# Patient Record
Sex: Male | Born: 1960 | Race: White | Hispanic: No | Marital: Married | Smoking: Former smoker
Health system: Southern US, Community
[De-identification: ages and names within clinical notes are randomized; demographics above are authoritative.]

## PROBLEM LIST (undated history)

## (undated) DIAGNOSIS — K219 Gastro-esophageal reflux disease without esophagitis: Secondary | ICD-10-CM

## (undated) DIAGNOSIS — E785 Hyperlipidemia, unspecified: Secondary | ICD-10-CM

## (undated) DIAGNOSIS — R7303 Prediabetes: Secondary | ICD-10-CM

## (undated) DIAGNOSIS — I10 Essential (primary) hypertension: Secondary | ICD-10-CM

## (undated) DIAGNOSIS — N200 Calculus of kidney: Secondary | ICD-10-CM

## (undated) DIAGNOSIS — R319 Hematuria, unspecified: Secondary | ICD-10-CM

## (undated) HISTORY — PX: HERNIA REPAIR: SHX51

## (undated) HISTORY — DX: Hyperlipidemia, unspecified: E78.5

## (undated) HISTORY — PX: KNEE ARTHROSCOPY: SUR90

## (undated) HISTORY — PX: FOOT SURGERY: SHX648

## (undated) HISTORY — PX: NASAL SINUS SURGERY: SHX719

---

## 2004-02-12 ENCOUNTER — Other Ambulatory Visit: Payer: Self-pay

## 2005-04-02 ENCOUNTER — Other Ambulatory Visit: Payer: Self-pay

## 2005-04-03 ENCOUNTER — Inpatient Hospital Stay: Payer: Self-pay | Admitting: Infectious Diseases

## 2009-07-14 HISTORY — PX: CHOLECYSTECTOMY: SHX55

## 2009-11-24 ENCOUNTER — Encounter: Admission: RE | Admit: 2009-11-24 | Discharge: 2009-11-24 | Payer: Self-pay | Admitting: Orthopedic Surgery

## 2010-07-23 ENCOUNTER — Emergency Department: Payer: Self-pay | Admitting: Emergency Medicine

## 2013-02-01 ENCOUNTER — Encounter: Payer: Self-pay | Admitting: Family Medicine

## 2013-02-01 ENCOUNTER — Ambulatory Visit (INDEPENDENT_AMBULATORY_CARE_PROVIDER_SITE_OTHER): Payer: PRIVATE HEALTH INSURANCE | Admitting: Family Medicine

## 2013-02-01 VITALS — BP 130/82 | Wt 299.2 lb

## 2013-02-01 DIAGNOSIS — Z79899 Other long term (current) drug therapy: Secondary | ICD-10-CM

## 2013-02-01 DIAGNOSIS — E785 Hyperlipidemia, unspecified: Secondary | ICD-10-CM

## 2013-02-01 DIAGNOSIS — R7303 Prediabetes: Secondary | ICD-10-CM

## 2013-02-01 DIAGNOSIS — R7309 Other abnormal glucose: Secondary | ICD-10-CM

## 2013-02-01 MED ORDER — PRAVASTATIN SODIUM 10 MG PO TABS: 10.0000 mg | ORAL_TABLET | Freq: Every evening | ORAL | Status: DC

## 2013-02-01 NOTE — Patient Instructions (Signed)
DASH Diet  The DASH diet stands for "Dietary Approaches to Stop Hypertension." It is a healthy eating plan that has been shown to reduce high blood pressure (hypertension) in as little as 14 days, while also possibly providing other significant health benefits. These other health benefits include reducing the risk of breast cancer after menopause and reducing the risk of type 2 diabetes, heart disease, colon cancer, and stroke. Health benefits also include weight loss and slowing kidney failure in patients with chronic kidney disease.   DIET GUIDELINES  · Limit salt (sodium). Your diet should contain less than 1500 mg of sodium daily.  · Limit refined or processed carbohydrates. Your diet should include mostly whole grains. Desserts and added sugars should be used sparingly.  · Include small amounts of heart-healthy fats. These types of fats include nuts, oils, and tub margarine. Limit saturated and trans fats. These fats have been shown to be harmful in the body.  CHOOSING FOODS   The following food groups are based on a 2000 calorie diet. See your Registered Dietitian for individual calorie needs.  Grains and Grain Products (6 to 8 servings daily)  · Eat More Often: Whole-wheat bread, brown rice, whole-grain or wheat pasta, quinoa, popcorn without added fat or salt (air popped).  · Eat Less Often: White bread, white pasta, white rice, cornbread.  Vegetables (4 to 5 servings daily)  · Eat More Often: Fresh, frozen, and canned vegetables. Vegetables may be raw, steamed, roasted, or grilled with a minimal amount of fat.  · Eat Less Often/Avoid: Creamed or fried vegetables. Vegetables in a cheese sauce.  Fruit (4 to 5 servings daily)  · Eat More Often: All fresh, canned (in natural juice), or frozen fruits. Dried fruits without added sugar. One hundred percent fruit juice (½ cup [237 mL] daily).  · Eat Less Often: Dried fruits with added sugar. Canned fruit in light or heavy syrup.  Lean Meats, Fish, and Poultry (2  servings or less daily. One serving is 3 to 4 oz [85-114 g]).  · Eat More Often: Ninety percent or leaner ground beef, tenderloin, sirloin. Round cuts of beef, chicken breast, turkey breast. All fish. Grill, bake, or broil your meat. Nothing should be fried.  · Eat Less Often/Avoid: Fatty cuts of meat, turkey, or chicken leg, thigh, or wing. Fried cuts of meat or fish.  Dairy (2 to 3 servings)  · Eat More Often: Low-fat or fat-free milk, low-fat plain or light yogurt, reduced-fat or part-skim cheese.  · Eat Less Often/Avoid: Milk (whole, 2%). Whole milk yogurt. Full-fat cheeses.  Nuts, Seeds, and Legumes (4 to 5 servings per week)  · Eat More Often: All without added salt.  · Eat Less Often/Avoid: Salted nuts and seeds, canned beans with added salt.  Fats and Sweets (limited)  · Eat More Often: Vegetable oils, tub margarines without trans fats, sugar-free gelatin. Mayonnaise and salad dressings.  · Eat Less Often/Avoid: Coconut oils, palm oils, butter, stick margarine, cream, half and half, cookies, candy, pie.  FOR MORE INFORMATION  The Dash Diet Eating Plan: www.dashdiet.org  Document Released: 06/19/2011 Document Revised: 09/22/2011 Document Reviewed: 06/19/2011  [employer]® Patient Information ©2014 [employer].

## 2013-02-01 NOTE — Progress Notes (Signed)
  Subjective:    Patient ID: Phillip Spencer, male    DOB: [birth year], 52 y.o.   MRN: [id number]  HPI  Patient comes in to be established. 30 minutes spent with patient discussing his issues including obesity hyperlipidemia mild elevation of glucose. He denies rectal bleeding he had a colonoscopy a few years ago. Patient does not smoke family history social history medical history reviewed   Review of Systems  Constitutional: Negative for fever, activity change and appetite change.  HENT: Negative for congestion, rhinorrhea and neck pain.   Eyes: Negative for discharge.  Respiratory: Negative for cough and wheezing.   Cardiovascular: Negative for chest pain.  Gastrointestinal: Negative for vomiting, abdominal pain and blood in stool.  Genitourinary: Negative for frequency and difficulty urinating.  Skin: Negative for rash.  Allergic/Immunologic: Negative for environmental allergies and food allergies.  Neurological: Negative for weakness and headaches.  Psychiatric/Behavioral: Negative for agitation.       Objective:   Physical Exam  Constitutional: He appears well-developed and well-nourished.  HENT:  Head: Normocephalic and atraumatic.  Right Ear: External ear normal.  Left Ear: External ear normal.  Nose: Nose normal.  Mouth/Throat: Oropharynx is clear and moist.  Eyes: EOM are normal. Pupils are equal, round, and reactive to light.  Neck: Normal range of motion. Neck supple. No thyromegaly present.  Cardiovascular: Normal rate, regular rhythm and normal heart sounds.   No murmur heard. Pulmonary/Chest: Effort normal and breath sounds normal. No respiratory distress. He has no wheezes.  Abdominal: Soft. Bowel sounds are normal. He exhibits no distension and no mass. There is no tenderness.  Genitourinary: Prostate normal.  Musculoskeletal: Normal range of motion. He exhibits no edema.  Lymphadenopathy:    He has no cervical adenopathy.  Neurological: He is alert. He  exhibits normal muscle tone.  Skin: Skin is warm and dry. No erythema.  Psychiatric: He has a normal mood and affect. His behavior is normal. Judgment normal.          Assessment & Plan:  Obesity-patient is trying to lose 25-30 pounds I think that would be a very good idea he is currently in [employer]. We talked about diet and exercise. Hyperlipidemia in the past he has not tolerated statins. Lipitor caused him severe muscle pain and joint pain. Try pravastatin 10 mg, start off one 3 times a week Monday Wednesday Friday. If any side effects followup immediately or call us. May stop medicine if necessary. Check lipid profile in approximately 4-6 weeks. Slowly titrate medication. Prediabetes-discussed the importance of minimizing starches exercising losing weight. Recheck hemoglobin A1c again in 3-6 months.

## 2013-04-01 LAB — LIPID PANEL
Total CHOL/HDL Ratio: 3.9 Ratio
VLDL: 36 mg/dL (ref 0–40)

## 2013-04-01 LAB — HEPATIC FUNCTION PANEL
ALT: 32 U/L (ref 0–53)
Albumin: 4.3 g/dL (ref 3.5–5.2)
Alkaline Phosphatase: 105 U/L (ref 39–117)
Indirect Bilirubin: 0.6 mg/dL (ref 0.0–0.9)
Total Protein: 7 g/dL (ref 6.0–8.3)

## 2013-05-04 ENCOUNTER — Ambulatory Visit: Payer: PRIVATE HEALTH INSURANCE | Admitting: Family Medicine

## 2013-05-05 ENCOUNTER — Ambulatory Visit: Payer: PRIVATE HEALTH INSURANCE | Admitting: Family Medicine

## 2013-05-12 ENCOUNTER — Ambulatory Visit (INDEPENDENT_AMBULATORY_CARE_PROVIDER_SITE_OTHER): Payer: PRIVATE HEALTH INSURANCE | Admitting: Family Medicine

## 2013-05-12 ENCOUNTER — Encounter: Payer: Self-pay | Admitting: Family Medicine

## 2013-05-12 VITALS — BP 120/90 | Ht 72.0 in | Wt 302.0 lb

## 2013-05-12 DIAGNOSIS — R7309 Other abnormal glucose: Secondary | ICD-10-CM

## 2013-05-12 DIAGNOSIS — Z79899 Other long term (current) drug therapy: Secondary | ICD-10-CM

## 2013-05-12 DIAGNOSIS — E785 Hyperlipidemia, unspecified: Secondary | ICD-10-CM

## 2013-05-12 DIAGNOSIS — R7303 Prediabetes: Secondary | ICD-10-CM

## 2013-05-12 NOTE — Patient Instructions (Signed)
DASH Diet  The DASH diet stands for "Dietary Approaches to Stop Hypertension." It is a healthy eating plan that has been shown to reduce high blood pressure (hypertension) in as little as 14 days, while also possibly providing other significant health benefits. These other health benefits include reducing the risk of breast cancer after menopause and reducing the risk of type 2 diabetes, heart disease, colon cancer, and stroke. Health benefits also include weight loss and slowing kidney failure in patients with chronic kidney disease.   DIET GUIDELINES  · Limit salt (sodium). Your diet should contain less than 1500 mg of sodium daily.  · Limit refined or processed carbohydrates. Your diet should include mostly whole grains. Desserts and added sugars should be used sparingly.  · Include small amounts of heart-healthy fats. These types of fats include nuts, oils, and tub margarine. Limit saturated and trans fats. These fats have been shown to be harmful in the body.  CHOOSING FOODS   The following food groups are based on a 2000 calorie diet. See your Registered Dietitian for individual calorie needs.  Grains and Grain Products (6 to 8 servings daily)  · Eat More Often: Whole-wheat bread, brown rice, whole-grain or wheat pasta, quinoa, popcorn without added fat or salt (air popped).  · Eat Less Often: White bread, white pasta, white rice, cornbread.  Vegetables (4 to 5 servings daily)  · Eat More Often: Fresh, frozen, and canned vegetables. Vegetables may be raw, steamed, roasted, or grilled with a minimal amount of fat.  · Eat Less Often/Avoid: Creamed or fried vegetables. Vegetables in a cheese sauce.  Fruit (4 to 5 servings daily)  · Eat More Often: All fresh, canned (in natural juice), or frozen fruits. Dried fruits without added sugar. One hundred percent fruit juice (½ cup [237 mL] daily).  · Eat Less Often: Dried fruits with added sugar. Canned fruit in light or heavy syrup.  Lean Meats, Fish, and Poultry (2  servings or less daily. One serving is 3 to 4 oz [85-114 g]).  · Eat More Often: Ninety percent or leaner ground beef, tenderloin, sirloin. Round cuts of beef, chicken breast, turkey breast. All fish. Grill, bake, or broil your meat. Nothing should be fried.  · Eat Less Often/Avoid: Fatty cuts of meat, turkey, or chicken leg, thigh, or wing. Fried cuts of meat or fish.  Dairy (2 to 3 servings)  · Eat More Often: Low-fat or fat-free milk, low-fat plain or light yogurt, reduced-fat or part-skim cheese.  · Eat Less Often/Avoid: Milk (whole, 2%). Whole milk yogurt. Full-fat cheeses.  Nuts, Seeds, and Legumes (4 to 5 servings per week)  · Eat More Often: All without added salt.  · Eat Less Often/Avoid: Salted nuts and seeds, canned beans with added salt.  Fats and Sweets (limited)  · Eat More Often: Vegetable oils, tub margarines without trans fats, sugar-free gelatin. Mayonnaise and salad dressings.  · Eat Less Often/Avoid: Coconut oils, palm oils, butter, stick margarine, cream, half and half, cookies, candy, pie.  FOR MORE INFORMATION  The Dash Diet Eating Plan: www.dashdiet.org  Document Released: 06/19/2011 Document Revised: 09/22/2011 Document Reviewed: 06/19/2011  [employer]® Patient Information ©2014 [employer].

## 2013-05-12 NOTE — Progress Notes (Signed)
  Subjective:    Patient ID: Phillip Spencer, male    DOB: [birth year], 52 y.o.   MRN: [id number]  Hyperlipidemia This is a chronic problem. The current episode started more than 1 month ago. There are no known factors aggravating his hyperlipidemia. Current antihyperlipidemic treatment includes statins. The current treatment provides moderate improvement of lipids. There are no compliance problems.  There are no known risk factors for coronary artery disease.  Patient states he has no concerns at this time.  Patient under a lot of stress at work. Causes him to get angry at times and stressed out. Denies being suicidal or homicidal. PMH benign see previous notes does not smoke He does try to eat healthy but he relates he's not exercising  Review of Systems Denies chest tightness pressure pain shortness of breath swelling in the legs denies wheezing    Objective:   Physical Exam  Lungs are clear hearts regular pulse normal blood pressure is good on recheck.      Assessment & Plan:  #1 significant stress test discussed different ways to handle this #2 hyperlipidemia is using pravastatin 5 days a week check lab work see were wrapped may need to go up on the dose he is not having any side effects #3 hyperglycemia check hemoglobin A1c weight a result.

## 2013-10-21 LAB — BASIC METABOLIC PANEL
BUN: 14 mg/dL (ref 6–23)
CO2: 24 mEq/L (ref 19–32)
Calcium: 9.2 mg/dL (ref 8.4–10.5)
Chloride: 107 mEq/L (ref 96–112)
Creat: 0.81 mg/dL (ref 0.50–1.35)
Glucose, Bld: 102 mg/dL — ABNORMAL HIGH (ref 70–99)
Potassium: 4.1 mEq/L (ref 3.5–5.3)
Sodium: 142 mEq/L (ref 135–145)

## 2013-10-21 LAB — LIPID PANEL
Cholesterol: 203 mg/dL — ABNORMAL HIGH (ref 0–200)
HDL: 39 mg/dL — ABNORMAL LOW (ref >39)
Total CHOL/HDL Ratio: 5.2 Ratio
Triglycerides: 572 mg/dL — ABNORMAL HIGH (ref <150)

## 2013-10-21 LAB — HEMOGLOBIN A1C
Hgb A1c MFr Bld: 5.8 % — ABNORMAL HIGH (ref <5.7)
Mean Plasma Glucose: 120 mg/dL — ABNORMAL HIGH (ref <117)

## 2013-11-25 ENCOUNTER — Ambulatory Visit (INDEPENDENT_AMBULATORY_CARE_PROVIDER_SITE_OTHER): Payer: PRIVATE HEALTH INSURANCE | Admitting: Family Medicine

## 2013-11-25 ENCOUNTER — Encounter: Payer: Self-pay | Admitting: Family Medicine

## 2013-11-25 VITALS — BP 150/82 | Wt 310.0 lb

## 2013-11-25 DIAGNOSIS — E781 Pure hyperglyceridemia: Secondary | ICD-10-CM | POA: Insufficient documentation

## 2013-11-25 DIAGNOSIS — E785 Hyperlipidemia, unspecified: Secondary | ICD-10-CM

## 2013-11-25 DIAGNOSIS — Z79899 Other long term (current) drug therapy: Secondary | ICD-10-CM

## 2013-11-25 MED ORDER — FENOFIBRATE 160 MG PO TABS: 160.0000 mg | ORAL_TABLET | Freq: Every day | ORAL | Status: DC

## 2013-11-25 NOTE — Patient Instructions (Signed)
If develop muscle soreness stop pravastatin and let me know.  In 8 weeks repeat labs

## 2013-11-25 NOTE — Progress Notes (Signed)
   Subjective:    Patient ID: Phillip Spencer, male    DOB: Jul 08, 1961, 53 y.o.   MRN: [id number]  HPI  Follow up Lab results Patient states he did not do a good job taking care of himself during the winter he ate too much did not exercise in he has been under a lot of stress. Greater than 50 minutes spent with patient discussing his lab results.   Review of Systems Patient denies chest pain shortness breath nausea vomiting diarrhea or swelling    Objective:   Physical Exam Lungs are clear hearts regular pulse normal       Assessment & Plan:  Significant elevation of triglycerides as patient needs to dramatically lower this I recommend starting fenofibrate. If he starts having muscle aches with it stop pravastatin. In addition to this recheck lipid liver profile in 8 weeks' time. Patient is to work hard on losing weight in the future if he is able to significantly lose weight get things under control we might consider stopping the medication for 8-12 weeks to see if his cholesterol will stay under control. Patient often followup in 6 months but is paced around the findings of the next 8 weeks lab work.

## 2014-01-09 ENCOUNTER — Ambulatory Visit (INDEPENDENT_AMBULATORY_CARE_PROVIDER_SITE_OTHER): Payer: PRIVATE HEALTH INSURANCE | Admitting: Family Medicine

## 2014-01-09 ENCOUNTER — Encounter: Payer: Self-pay | Admitting: Family Medicine

## 2014-01-09 VITALS — BP 132/90 | Temp 98.5°F | Ht 72.0 in | Wt 314.0 lb

## 2014-01-09 DIAGNOSIS — H60391 Other infective otitis externa, right ear: Secondary | ICD-10-CM

## 2014-01-09 DIAGNOSIS — H60399 Other infective otitis externa, unspecified ear: Secondary | ICD-10-CM

## 2014-01-09 MED ORDER — OFLOXACIN 0.3 % OT SOLN: 5.0000 [drp] | Freq: Two times a day (BID) | OTIC | Status: DC

## 2014-01-09 MED ORDER — OFLOXACIN 0.3 % OT SOLN: 5.0000 [drp] | Freq: Two times a day (BID) | OTIC | Status: AC

## 2014-01-09 MED ORDER — CIPROFLOXACIN HCL 500 MG PO TABS
500.0000 mg | ORAL_TABLET | Freq: Two times a day (BID) | ORAL | Status: DC
Start: 2014-01-09 — End: 2014-01-09

## 2014-01-09 MED ORDER — CIPROFLOXACIN HCL 500 MG PO TABS: 500.0000 mg | ORAL_TABLET | Freq: Two times a day (BID) | ORAL | Status: DC

## 2014-01-09 NOTE — Progress Notes (Signed)
   Subjective:    Patient ID: Phillip Spencer, male    DOB: 1960-11-29, 53 y.o.   MRN: [id number]  Otalgia  There is pain in the right ear. This is a new problem. The current episode started in the past 7 days. The problem occurs constantly. The problem has been waxing and waning. There has been no fever. The pain is moderate. He has tried NSAIDs and ear drops for the symptoms. The treatment provided mild relief.    Started last weds ear pain, diminshed hearing   Review of Systems  HENT: Positive for ear pain.    denies high fever wheezing chest tightness pressure pain     Objective:   Physical Exam Lungs are clear heart regular rate otitis externa noted left eardrum normal       Assessment & Plan:  Otitis externa drops and antibiotics recommended followup if ongoing troubles  Regular followup later this year

## 2014-02-23 ENCOUNTER — Other Ambulatory Visit: Payer: Self-pay | Admitting: *Deleted

## 2014-02-23 MED ORDER — PRAVASTATIN SODIUM 10 MG PO TABS: 10.0000 mg | ORAL_TABLET | Freq: Every evening | ORAL | Status: DC

## 2014-03-12 ENCOUNTER — Emergency Department: Payer: Self-pay | Admitting: Emergency Medicine

## 2014-03-12 LAB — URINALYSIS, COMPLETE
BACTERIA: NONE SEEN
Bilirubin,UR: NEGATIVE
Glucose,UR: NEGATIVE mg/dL (ref 0–75)
KETONE: NEGATIVE
Leukocyte Esterase: NEGATIVE
Nitrite: NEGATIVE
PH: 6 (ref 4.5–8.0)
PROTEIN: NEGATIVE
Specific Gravity: 1.015 (ref 1.003–1.030)
WBC UR: 1 /HPF (ref 0–5)

## 2014-03-12 LAB — COMPREHENSIVE METABOLIC PANEL
ALBUMIN: 3.6 g/dL (ref 3.4–5.0)
ALK PHOS: 95 U/L
AST: 34 U/L (ref 15–37)
Anion Gap: 8 (ref 7–16)
BILIRUBIN TOTAL: 0.3 mg/dL (ref 0.2–1.0)
BUN: 13 mg/dL (ref 7–18)
CHLORIDE: 109 mmol/L — AB (ref 98–107)
CO2: 24 mmol/L (ref 21–32)
CREATININE: 1.2 mg/dL (ref 0.60–1.30)
Calcium, Total: 8.9 mg/dL (ref 8.5–10.1)
EGFR (Non-African Amer.): >60
Glucose: 140 mg/dL — ABNORMAL HIGH (ref 65–99)
OSMOLALITY: 284 (ref 275–301)
Potassium: 3.8 mmol/L (ref 3.5–5.1)
SGPT (ALT): 54 U/L
Sodium: 141 mmol/L (ref 136–145)
TOTAL PROTEIN: 7.1 g/dL (ref 6.4–8.2)

## 2014-03-12 LAB — CBC WITH DIFFERENTIAL/PLATELET
Basophil #: 0 10*3/uL (ref 0.0–0.1)
Basophil %: 0.5 %
Eosinophil #: 0.2 10*3/uL (ref 0.0–0.7)
Eosinophil %: 2.1 %
HCT: 43.7 % (ref 40.0–52.0)
HGB: 14.6 g/dL (ref 13.0–18.0)
LYMPHS ABS: 3 10*3/uL (ref 1.0–3.6)
LYMPHS PCT: 32.8 %
MCH: 31.6 pg (ref 26.0–34.0)
MCHC: 33.3 g/dL (ref 32.0–36.0)
MCV: 95 fL (ref 80–100)
Monocyte #: 0.6 x10 3/mm (ref 0.2–1.0)
Monocyte %: 6.5 %
NEUTROS PCT: 58.1 %
Neutrophil #: 5.3 10*3/uL (ref 1.4–6.5)
Platelet: 252 10*3/uL (ref 150–440)
RBC: 4.6 10*6/uL (ref 4.40–5.90)
RDW: 13.1 % (ref 11.5–14.5)
WBC: 9.1 10*3/uL (ref 3.8–10.6)

## 2014-04-14 LAB — LIPID PANEL
CHOLESTEROL: 195 mg/dL (ref 0–200)
HDL: 52 mg/dL (ref >39)
LDL Cholesterol: 102 mg/dL — ABNORMAL HIGH (ref 0–99)
TRIGLYCERIDES: 204 mg/dL — AB (ref <150)
Total CHOL/HDL Ratio: 3.8 Ratio
VLDL: 41 mg/dL — ABNORMAL HIGH (ref 0–40)

## 2014-04-14 LAB — HEPATIC FUNCTION PANEL
ALBUMIN: 4.2 g/dL (ref 3.5–5.2)
ALK PHOS: 90 U/L (ref 39–117)
ALT: 37 U/L (ref 0–53)
AST: 25 U/L (ref 0–37)
BILIRUBIN INDIRECT: 0.4 mg/dL (ref 0.2–1.2)
Bilirubin, Direct: 0.1 mg/dL (ref 0.0–0.3)
TOTAL PROTEIN: 6.6 g/dL (ref 6.0–8.3)
Total Bilirubin: 0.5 mg/dL (ref 0.2–1.2)

## 2014-05-25 ENCOUNTER — Ambulatory Visit (INDEPENDENT_AMBULATORY_CARE_PROVIDER_SITE_OTHER): Payer: PRIVATE HEALTH INSURANCE | Admitting: Family Medicine

## 2014-05-25 ENCOUNTER — Encounter: Payer: Self-pay | Admitting: Family Medicine

## 2014-05-25 VITALS — BP 132/84 | Temp 98.4°F | Ht 72.0 in | Wt 313.5 lb

## 2014-05-25 DIAGNOSIS — R319 Hematuria, unspecified: Secondary | ICD-10-CM

## 2014-05-25 DIAGNOSIS — N200 Calculus of kidney: Secondary | ICD-10-CM

## 2014-05-25 LAB — POCT URINALYSIS DIPSTICK
RBC UA: 50
Spec Grav, UA: 1.02
pH, UA: 5

## 2014-05-25 MED ORDER — OXYCODONE-ACETAMINOPHEN 10-325 MG PO TABS: 1.0000 | ORAL_TABLET | ORAL | Status: DC | PRN

## 2014-05-25 MED ORDER — TAMSULOSIN HCL 0.4 MG PO CAPS: 0.4000 mg | ORAL_CAPSULE | Freq: Every day | ORAL | Status: DC

## 2014-05-25 NOTE — Progress Notes (Signed)
   Subjective:    Patient ID: Phillip Spencer, male    DOB: Feb 11, 1961, 53 y.o.   MRN: [id number]  Back Pain This is a new problem. The current episode started 1 to 4 weeks ago. The problem occurs intermittently. The problem is unchanged. The pain is present in the lumbar spine. The quality of the pain is described as aching. The pain is severe. The pain is the same all the time. (Dark urine, testicle discomfort, flank pain) Treatments tried: flomax, oxycodone. The treatment provided moderate relief.   Patient was treated at [facility] about 2 weeks ago for this.  Patient had a CAT scan there showed a small stone his urine at times dark other times light relates pain in the back radiates around toward from into the groin sharp sometimes severe sometimes not so much  Review of Systems  Musculoskeletal: Positive for back pain.       Objective:   Physical Exam Patient not toxic flanks nontender abdomen soft lungs clear  Skin warm dry     Assessment & Plan:  Renal colic-Flomax daily, Percocet every 4 hours when necessary severe pain cautioned drowsiness, plenty of fluids, urology referral. If significantly worse go to ER. Also if able to obtain stone bring it for analysis  Warning signs regarding blockages and infection discussed

## 2014-06-06 ENCOUNTER — Other Ambulatory Visit: Payer: Self-pay | Admitting: *Deleted

## 2014-06-06 MED ORDER — FENOFIBRATE 160 MG PO TABS: 160.0000 mg | ORAL_TABLET | Freq: Every day | ORAL | Status: DC

## 2014-06-22 ENCOUNTER — Ambulatory Visit (INDEPENDENT_AMBULATORY_CARE_PROVIDER_SITE_OTHER): Payer: PRIVATE HEALTH INSURANCE | Admitting: Family Medicine

## 2014-06-22 ENCOUNTER — Other Ambulatory Visit: Payer: Self-pay | Admitting: *Deleted

## 2014-06-22 ENCOUNTER — Encounter: Payer: Self-pay | Admitting: Family Medicine

## 2014-06-22 VITALS — BP 148/88 | Ht 70.5 in | Wt 307.0 lb

## 2014-06-22 DIAGNOSIS — E785 Hyperlipidemia, unspecified: Secondary | ICD-10-CM

## 2014-06-22 DIAGNOSIS — R7303 Prediabetes: Secondary | ICD-10-CM

## 2014-06-22 DIAGNOSIS — Z125 Encounter for screening for malignant neoplasm of prostate: Secondary | ICD-10-CM

## 2014-06-22 DIAGNOSIS — R7309 Other abnormal glucose: Secondary | ICD-10-CM

## 2014-06-22 DIAGNOSIS — Z79899 Other long term (current) drug therapy: Secondary | ICD-10-CM

## 2014-06-22 DIAGNOSIS — Z0000 Encounter for general adult medical examination without abnormal findings: Secondary | ICD-10-CM

## 2014-06-22 MED ORDER — PRAVASTATIN SODIUM 10 MG PO TABS: 10.0000 mg | ORAL_TABLET | Freq: Every evening | ORAL | Status: DC

## 2014-06-22 MED ORDER — FENOFIBRATE 160 MG PO TABS: 160.0000 mg | ORAL_TABLET | Freq: Every day | ORAL | Status: DC

## 2014-06-22 NOTE — Progress Notes (Signed)
   Subjective:    Patient ID: Phillip Spencer, male    DOB: 1960/07/25, 53 y.o.   MRN: [id number]  HPI The patient comes in today for a wellness visit.    A review of their health history was completed.  A review of medications was also completed.  Any needed refills;requesting  90 day refills on  fenofibrate, pravastatin.  Eating habits: health conscious  Falls/  MVA accidents in past few months: none  Regular exercise: yes walks on treadmill 30 -40 min 3 -4 times weekly, and racquetball.   Specialist pt sees on regular basis: GI doctor and urologist - Dr. Berneice HeinrichManny for kidney stone   Preventative health issues were discussed.   Additional concerns: none    Review of Systems  Constitutional: Negative for fever, activity change and appetite change.  HENT: Negative for congestion and rhinorrhea.   Eyes: Negative for discharge.  Respiratory: Negative for cough and wheezing.   Cardiovascular: Negative for chest pain.  Gastrointestinal: Negative for vomiting, abdominal pain and blood in stool.  Genitourinary: Negative for frequency and difficulty urinating.  Musculoskeletal: Negative for neck pain.  Skin: Negative for rash.  Allergic/Immunologic: Negative for environmental allergies and food allergies.  Neurological: Negative for weakness and headaches.  Psychiatric/Behavioral: Negative for agitation.       Objective:   Physical Exam  Constitutional: He appears well-developed and well-nourished.  HENT:  Head: Normocephalic and atraumatic.  Right Ear: External ear normal.  Left Ear: External ear normal.  Nose: Nose normal.  Mouth/Throat: Oropharynx is clear and moist.  Eyes: EOM are normal. Pupils are equal, round, and reactive to light.  Neck: Normal range of motion. Neck supple. No thyromegaly present.  Cardiovascular: Normal rate, regular rhythm and normal heart sounds.   No murmur heard. Pulmonary/Chest: Effort normal and breath sounds normal. No respiratory  distress. He has no wheezes.  Abdominal: Soft. Bowel sounds are normal. He exhibits no distension and no mass. There is no tenderness.  Genitourinary: Rectum normal, prostate normal and penis normal.  Musculoskeletal: Normal range of motion. He exhibits no edema.  Lymphadenopathy:    He has no cervical adenopathy.  Neurological: He is alert. He exhibits normal muscle tone.  Skin: Skin is warm and dry. No erythema.  Psychiatric: He has a normal mood and affect. His behavior is normal. Judgment normal.          Assessment & Plan:  Wellness exam-safety dietary measures all discussed patient was told it very very important for him to try to watch diet lose weight and get his health under better control.  Hyperlipidemia he will check lab work await the findings continue the medication.  PSA ordered prostate exam normal  Patient up-to-date on colonoscopy

## 2014-07-05 ENCOUNTER — Encounter: Payer: Self-pay | Admitting: Family Medicine

## 2014-07-05 LAB — BASIC METABOLIC PANEL
BUN: 18 mg/dL (ref 6–23)
CHLORIDE: 105 meq/L (ref 96–112)
CO2: 26 meq/L (ref 19–32)
Calcium: 9.7 mg/dL (ref 8.4–10.5)
Creat: 0.93 mg/dL (ref 0.50–1.35)
Glucose, Bld: 107 mg/dL — ABNORMAL HIGH (ref 70–99)
POTASSIUM: 4.4 meq/L (ref 3.5–5.3)
SODIUM: 139 meq/L (ref 135–145)

## 2014-07-05 LAB — LIPID PANEL
Cholesterol: 193 mg/dL (ref 0–200)
HDL: 55 mg/dL (ref >39)
LDL Cholesterol: 106 mg/dL — ABNORMAL HIGH (ref 0–99)
TRIGLYCERIDES: 162 mg/dL — AB (ref <150)
Total CHOL/HDL Ratio: 3.5 Ratio
VLDL: 32 mg/dL (ref 0–40)

## 2014-07-05 LAB — HEMOGLOBIN A1C
HEMOGLOBIN A1C: 6.1 % — AB (ref <5.7)
Mean Plasma Glucose: 128 mg/dL — ABNORMAL HIGH (ref <117)

## 2014-07-05 LAB — PSA: PSA: 0.37 ng/mL (ref <=4.00)

## 2014-08-09 ENCOUNTER — Other Ambulatory Visit: Payer: Self-pay | Admitting: Urology

## 2014-08-31 ENCOUNTER — Encounter (HOSPITAL_BASED_OUTPATIENT_CLINIC_OR_DEPARTMENT_OTHER): Payer: Self-pay | Admitting: *Deleted

## 2014-09-01 ENCOUNTER — Encounter (HOSPITAL_BASED_OUTPATIENT_CLINIC_OR_DEPARTMENT_OTHER): Payer: Self-pay | Admitting: *Deleted

## 2014-09-01 NOTE — Progress Notes (Signed)
Pt instructed npo pmn x nexium w sip of water.  To [facility] 2/24 @ 0700.  Needs istat 8 on arrival.

## 2014-09-06 ENCOUNTER — Encounter (HOSPITAL_BASED_OUTPATIENT_CLINIC_OR_DEPARTMENT_OTHER): Payer: Self-pay | Admitting: *Deleted

## 2014-09-06 ENCOUNTER — Ambulatory Visit (HOSPITAL_BASED_OUTPATIENT_CLINIC_OR_DEPARTMENT_OTHER): Payer: PRIVATE HEALTH INSURANCE | Admitting: Anesthesiology

## 2014-09-06 ENCOUNTER — Other Ambulatory Visit: Payer: Self-pay | Admitting: Urology

## 2014-09-06 ENCOUNTER — Encounter (HOSPITAL_BASED_OUTPATIENT_CLINIC_OR_DEPARTMENT_OTHER)
Admission: RE | Disposition: A | Discharge status: Home / Self Care | Payer: Self-pay | Source: Ambulatory Visit | Attending: Urology

## 2014-09-06 ENCOUNTER — Ambulatory Visit (HOSPITAL_BASED_OUTPATIENT_CLINIC_OR_DEPARTMENT_OTHER)
Admission: RE | Admit: 2014-09-06 | Discharge: 2014-09-06 | Disposition: A | Discharge status: Home / Self Care | Payer: PRIVATE HEALTH INSURANCE | Source: Ambulatory Visit | Attending: Urology | Admitting: Urology

## 2014-09-06 DIAGNOSIS — Z9049 Acquired absence of other specified parts of digestive tract: Secondary | ICD-10-CM | POA: Diagnosis not present

## 2014-09-06 DIAGNOSIS — K219 Gastro-esophageal reflux disease without esophagitis: Secondary | ICD-10-CM | POA: Insufficient documentation

## 2014-09-06 DIAGNOSIS — Z87891 Personal history of nicotine dependence: Secondary | ICD-10-CM | POA: Diagnosis not present

## 2014-09-06 DIAGNOSIS — N200 Calculus of kidney: Secondary | ICD-10-CM | POA: Insufficient documentation

## 2014-09-06 DIAGNOSIS — E785 Hyperlipidemia, unspecified: Secondary | ICD-10-CM | POA: Diagnosis not present

## 2014-09-06 DIAGNOSIS — Z6841 Body Mass Index (BMI) 40.0 and over, adult: Secondary | ICD-10-CM | POA: Insufficient documentation

## 2014-09-06 DIAGNOSIS — E669 Obesity, unspecified: Secondary | ICD-10-CM | POA: Insufficient documentation

## 2014-09-06 HISTORY — DX: Gastro-esophageal reflux disease without esophagitis: K21.9

## 2014-09-06 HISTORY — DX: Hematuria, unspecified: R31.9

## 2014-09-06 HISTORY — DX: Prediabetes: R73.03

## 2014-09-06 HISTORY — DX: Calculus of kidney: N20.0

## 2014-09-06 HISTORY — PX: CYSTOSCOPY WITH RETROGRADE PYELOGRAM, URETEROSCOPY AND STENT PLACEMENT: SHX5789

## 2014-09-06 LAB — POCT I-STAT, CHEM 8
BUN: 19 mg/dL (ref 6–23)
CALCIUM ION: 1.25 mmol/L — AB (ref 1.12–1.23)
CREATININE: 0.9 mg/dL (ref 0.50–1.35)
Chloride: 104 mmol/L (ref 96–112)
Glucose, Bld: 113 mg/dL — ABNORMAL HIGH (ref 70–99)
HCT: 47 % (ref 39.0–52.0)
HEMOGLOBIN: 16 g/dL (ref 13.0–17.0)
Potassium: 4.1 mmol/L (ref 3.5–5.1)
SODIUM: 141 mmol/L (ref 135–145)
TCO2: 22 mmol/L (ref 0–100)

## 2014-09-06 SURGERY — CYSTOURETEROSCOPY, WITH RETROGRADE PYELOGRAM AND STENT INSERTION
Anesthesia: General | Site: Ureter | Laterality: Right

## 2014-09-06 MED ORDER — FENTANYL CITRATE 0.05 MG/ML IJ SOLN
INTRAMUSCULAR | Status: DC | PRN
  Administered 2014-09-06 (×2): 50 ug via INTRAVENOUS

## 2014-09-06 MED ORDER — FENTANYL CITRATE 0.05 MG/ML IJ SOLN
25.0000 ug | INTRAMUSCULAR | Status: DC | PRN
  Filled 2014-09-06: qty 1

## 2014-09-06 MED ORDER — SODIUM CHLORIDE 0.9 % IR SOLN
Status: DC | PRN
  Administered 2014-09-06: 500 mL
  Administered 2014-09-06: 6000 mL via INTRAVESICAL

## 2014-09-06 MED ORDER — PHENAZOPYRIDINE HCL 100 MG PO TABS
ORAL_TABLET | ORAL | Status: AC
  Filled 2014-09-06: qty 2

## 2014-09-06 MED ORDER — OXYBUTYNIN CHLORIDE 5 MG PO TABS
ORAL_TABLET | ORAL | Status: AC
  Filled 2014-09-06: qty 1

## 2014-09-06 MED ORDER — PROPOFOL 10 MG/ML IV BOLUS
INTRAVENOUS | Status: DC | PRN
  Administered 2014-09-06: 200 mg via INTRAVENOUS

## 2014-09-06 MED ORDER — TAMSULOSIN HCL 0.4 MG PO CAPS
0.4000 mg | ORAL_CAPSULE | Freq: Every day | ORAL | Status: DC
  Administered 2014-09-06: 0.4 mg via ORAL
  Filled 2014-09-06: qty 1

## 2014-09-06 MED ORDER — ACETAMINOPHEN 160 MG/5ML PO SOLN
960.0000 mg | Freq: Four times a day (QID) | ORAL | Status: DC | PRN
  Administered 2014-09-06: 960 mg via ORAL
  Filled 2014-09-06: qty 30

## 2014-09-06 MED ORDER — MIDAZOLAM HCL 5 MG/5ML IJ SOLN
INTRAMUSCULAR | Status: DC | PRN
  Administered 2014-09-06: 2 mg via INTRAVENOUS

## 2014-09-06 MED ORDER — LIDOCAINE HCL (CARDIAC) 20 MG/ML IV SOLN
INTRAVENOUS | Status: DC | PRN
  Administered 2014-09-06: 100 mg via INTRAVENOUS

## 2014-09-06 MED ORDER — ONDANSETRON HCL 4 MG/2ML IJ SOLN
INTRAMUSCULAR | Status: DC | PRN
  Administered 2014-09-06: 4 mg via INTRAVENOUS

## 2014-09-06 MED ORDER — OXYBUTYNIN CHLORIDE 5 MG PO TABS
5.0000 mg | ORAL_TABLET | Freq: Three times a day (TID) | ORAL | Status: DC | PRN
  Administered 2014-09-06: 5 mg via ORAL
  Filled 2014-09-06: qty 1

## 2014-09-06 MED ORDER — TAMSULOSIN HCL 0.4 MG PO CAPS
ORAL_CAPSULE | ORAL | Status: AC
  Filled 2014-09-06: qty 1

## 2014-09-06 MED ORDER — GENTAMICIN IN SALINE 1.6-0.9 MG/ML-% IV SOLN
80.0000 mg | INTRAVENOUS | Status: AC
  Administered 2014-09-06: 510 mg via INTRAVENOUS
  Filled 2014-09-06: qty 50

## 2014-09-06 MED ORDER — TAMSULOSIN HCL 0.4 MG PO CAPS: 0.4000 mg | ORAL_CAPSULE | Freq: Every day | ORAL | Status: DC

## 2014-09-06 MED ORDER — LACTATED RINGERS IV SOLN
INTRAVENOUS | Status: DC
  Administered 2014-09-06 (×2): via INTRAVENOUS
  Filled 2014-09-06: qty 1000

## 2014-09-06 MED ORDER — DEXAMETHASONE SODIUM PHOSPHATE 4 MG/ML IJ SOLN
INTRAMUSCULAR | Status: DC | PRN
  Administered 2014-09-06: 10 mg via INTRAVENOUS

## 2014-09-06 MED ORDER — PHENAZOPYRIDINE HCL 200 MG PO TABS
200.0000 mg | ORAL_TABLET | Freq: Three times a day (TID) | ORAL | Status: DC
  Administered 2014-09-06: 200 mg via ORAL
  Filled 2014-09-06: qty 1

## 2014-09-06 MED ORDER — HYDROMORPHONE HCL 2 MG PO TABS
2.0000 mg | ORAL_TABLET | ORAL | Status: DC | PRN
  Administered 2014-09-06: 2 mg via ORAL
  Filled 2014-09-06: qty 1

## 2014-09-06 MED ORDER — KETOROLAC TROMETHAMINE 30 MG/ML IJ SOLN
INTRAMUSCULAR | Status: DC | PRN
  Administered 2014-09-06: 30 mg via INTRAVENOUS

## 2014-09-06 MED ORDER — HYDROMORPHONE HCL 2 MG PO TABS: 2.0000 mg | ORAL_TABLET | ORAL | Status: DC | PRN

## 2014-09-06 MED ORDER — MIDAZOLAM HCL 2 MG/2ML IJ SOLN
INTRAMUSCULAR | Status: AC
  Filled 2014-09-06: qty 2

## 2014-09-06 MED ORDER — OXYBUTYNIN CHLORIDE 5 MG PO TABS: 5.0000 mg | ORAL_TABLET | Freq: Three times a day (TID) | ORAL | Status: DC | PRN

## 2014-09-06 MED ORDER — FENTANYL CITRATE 0.05 MG/ML IJ SOLN
INTRAMUSCULAR | Status: AC
  Filled 2014-09-06: qty 4

## 2014-09-06 MED ORDER — IOHEXOL 350 MG/ML SOLN
INTRAVENOUS | Status: DC | PRN
  Administered 2014-09-06: 10 mL via URETHRAL

## 2014-09-06 MED ORDER — SENNOSIDES-DOCUSATE SODIUM 8.6-50 MG PO TABS: 1.0000 | ORAL_TABLET | Freq: Two times a day (BID) | ORAL | Status: DC

## 2014-09-06 MED ORDER — HYDROMORPHONE HCL 2 MG PO TABS
ORAL_TABLET | ORAL | Status: AC
Start: 2014-09-06 — End: 2014-09-06
  Filled 2014-09-06: qty 1

## 2014-09-06 SURGICAL SUPPLY — 21 items
BAG DRAIN URO-CYSTO SKYTR STRL (DRAIN) ×4 IMPLANT
CANISTER SUCT LVC 12 LTR MEDI- (MISCELLANEOUS) ×4 IMPLANT
CATH INTERMIT  6FR 70CM (CATHETERS) ×4 IMPLANT
CATH URET DUAL LUMEN 6-10FR 50 (CATHETERS) ×4 IMPLANT
CLOTH BEACON ORANGE TIMEOUT ST (SAFETY) ×4 IMPLANT
GLOVE BIO SURGEON STRL SZ 6.5 (GLOVE) ×3 IMPLANT
GLOVE BIO SURGEON STRL SZ7.5 (GLOVE) ×4 IMPLANT
GLOVE BIO SURGEONS STRL SZ 6.5 (GLOVE) ×1
GLOVE BIOGEL PI IND STRL 6.5 (GLOVE) ×2 IMPLANT
GLOVE BIOGEL PI INDICATOR 6.5 (GLOVE) ×2
GOWN STRL REUS W/ TWL LRG LVL3 (GOWN DISPOSABLE) ×2 IMPLANT
GOWN STRL REUS W/TWL LRG LVL3 (GOWN DISPOSABLE) ×2
GOWN STRL REUS W/TWL XL LVL3 (GOWN DISPOSABLE) ×4 IMPLANT
GUIDEWIRE ANG ZIPWIRE 038X150 (WIRE) ×4 IMPLANT
GUIDEWIRE STR DUAL SENSOR (WIRE) ×4 IMPLANT
IV NS IRRIG 3000ML ARTHROMATIC (IV SOLUTION) ×8 IMPLANT
NS IRRIG 500ML POUR BTL (IV SOLUTION) ×4 IMPLANT
PACK CYSTO (CUSTOM PROCEDURE TRAY) ×4 IMPLANT
STENT URET 6FRX26 CONTOUR (STENTS) ×4 IMPLANT
SYRINGE 10CC LL (SYRINGE) ×4 IMPLANT
TUBE FEEDING 8FR 16IN STR KANG (MISCELLANEOUS) ×4 IMPLANT

## 2014-09-06 NOTE — Anesthesia Preprocedure Evaluation (Signed)
Anesthesia Evaluation  Patient identified by MRN, date of birth, ID band Patient awake    Reviewed: Allergy & Precautions, H&P , Patient's Chart, lab work & pertinent test results, reviewed documented beta blocker date and time   Airway Mallampati: II  TM Distance: >3 FB Neck ROM: full    Dental no notable dental hx.    Pulmonary former smoker,  breath sounds clear to auscultation  Pulmonary exam normal       Cardiovascular Rhythm:regular Rate:Normal     Neuro/Psych    GI/Hepatic GERD-  Medicated,  Endo/Other    Renal/GU      Musculoskeletal   Abdominal   Peds  Hematology   Anesthesia Other Findings   Reproductive/Obstetrics                             Anesthesia Physical Anesthesia Plan  ASA: III  Anesthesia Plan:    Post-op Pain Management:    Induction: Intravenous  Airway Management Planned: LMA  Additional Equipment:   Intra-op Plan:   Post-operative Plan:   Informed Consent: I have reviewed the patients History and Physical, chart, labs and discussed the procedure including the risks, benefits and alternatives for the proposed anesthesia with the patient or authorized representative who has indicated his/her understanding and acceptance.   Dental Advisory Given and Dental advisory given  Plan Discussed with: CRNA and Surgeon  Anesthesia Plan Comments: (Discussed GA with LMA, possible sore throat, potential need to switch to ETT, N/V, pulmonary aspiration. Questions answered. )        Anesthesia Quick Evaluation

## 2014-09-06 NOTE — Transfer of Care (Signed)
Immediate Anesthesia Transfer of Care Note  Patient: Phillip BonierCharles S Albaugh  Procedure(s) Performed: Procedure(s) (LRB): CYSTOSCOPY WITH RETROGRADE PYELOGRAM, RIGHT DIAGNOSTIC URETEROSCOPY AND STENT PLACEMENT (Right)  Patient Location: PACU  Anesthesia Type: General  Level of Consciousness: awake, alert  and oriented  Airway & Oxygen Therapy: Patient Spontanous Breathing and Patient connected to face mask oxygen  Post-op Assessment: Report given to PACU RN and Post -op Vital signs reviewed and stable  Post vital signs: Reviewed and stable  Complications: No apparent anesthesia complicationsLast Vitals:  Filed Vitals:   09/06/14 0721  BP: 149/88  Pulse: 62  Temp: 37 C  Resp: 16

## 2014-09-06 NOTE — Anesthesia Postprocedure Evaluation (Signed)
  Anesthesia Post-op Note  Patient: Phillip Bonierharles S Speirs  Procedure(s) Performed: Procedure(s): CYSTOSCOPY WITH RETROGRADE PYELOGRAM, RIGHT DIAGNOSTIC URETEROSCOPY AND STENT PLACEMENT (Right) Patient is awake and responsive. Pain and nausea are reasonably well controlled. Vital signs are stable and clinically acceptable. Oxygen saturation is clinically acceptable. There are no apparent anesthetic complications at this time. Patient is ready for discharge.

## 2014-09-06 NOTE — Discharge Instructions (Signed)
1 - You may have urinary urgency (bladder spasms) and bloody urine on / off with stent in place. This is normal. ° °2 - Call MD or go to ER for fever >102, severe pain / nausea / vomiting not relieved by medications, or acute change in medical status °[facility] °[phone] °Post Ureteroscopy With or Without Stent Instructions ° °Definitions: ° °Ureter: The duct that transports urine from the kidney to the bladder. °Stent:   A plastic hollow tube that is placed into the ureter, from the kidney to the                 bladder to prevent the ureter from swelling shut. ° °GENERAL INSTRUCTIONS: ° °Despite the fact that no skin incisions were used, the area around the ureter and bladder is raw and irritated. The stent is a foreign body which will further irritate the bladder wall. This irritation is manifested by increased frequency of urination, both day and night, and by an increase in the urge to urinate. In some, the urge to urinate is present almost always. Sometimes the urge is strong enough that you may not be able to stop yourself from urinating. The only real cure is to remove the stent and then give time for the bladder wall to heal which can't be done until the danger of the ureter swelling shut has passed, which varies. ° °You may see some blood in your urine while the stent is in place and a few days afterwards. Do not be alarmed, even if the urine was clear for a while. Get off your feet and drink lots of fluids until clearing occurs. If you start to pass clots or don't improve, call us. ° °DIET: °You may return to your normal diet immediately. Because of the raw surface of your bladder, alcohol, spicy foods, acid type foods and drinks with caffeine may cause irritation or frequency and should be used in moderation. To keep your urine flowing freely and to avoid constipation, drink plenty of fluids during the day ( 8-10 glasses ). °Tip: Avoid cranberry juice because it is very  acidic. ° °ACTIVITY: °Your physical activity doesn't need to be restricted. However, if you are very active, you may see some blood in your urine. We suggest that you reduce your activity under these circumstances until the bleeding has stopped. ° °BOWELS: °It is important to keep your bowels regular during the postoperative period. Straining with bowel movements can cause bleeding. A bowel movement every other day is reasonable. Use a mild laxative if needed, such as Milk of Magnesia 2-3 tablespoons, or 2 Dulcolax tablets. Call if you continue to have problems. If you have been taking narcotics for pain, before, during or after your surgery, you may be constipated. Take a laxative if necessary. ° ° °MEDICATION: °You should resume your pre-surgery medications unless told not to. In addition you will often be given an antibiotic to prevent infection. These should be taken as prescribed until the bottles are finished unless you are having an unusual reaction to one of the drugs. ° °PROBLEMS YOU SHOULD REPORT TO US: °· Fevers over 100.5 Fahrenheit. °· Heavy bleeding, or clots ( See above notes about blood in urine ). °· Inability to urinate. °· Drug reactions ( hives, rash, nausea, vomiting, diarrhea ). °· Severe burning or pain with urination that is not improving. ° °FOLLOW-UP: °You will need a follow-up appointment to monitor your progress. Call for this appointment at the number listed above.   Usually the first appointment will be about three to fourteen days after your surgery. ° ° ° ° ° °Post Anesthesia Home Care Instructions ° °Activity: °Get plenty of rest for the remainder of the day. A responsible adult should stay with you for 24 hours following the procedure.  °For the next 24 hours, DO NOT: °-Drive a car °-Operate machinery °-Drink alcoholic beverages °-Take any medication unless instructed by your physician °-Make any legal decisions or sign important papers. ° °Meals: °Start with liquid foods such as  gelatin or soup. Progress to regular foods as tolerated. Avoid greasy, spicy, heavy foods. If nausea and/or vomiting occur, drink only clear liquids until the nausea and/or vomiting subsides. Call your physician if vomiting continues. ° °Special Instructions/Symptoms: °Your throat may feel dry or sore from the anesthesia or the breathing tube placed in your throat during surgery. If this causes discomfort, gargle with warm salt water. The discomfort should disappear within 24 hours. ° °

## 2014-09-06 NOTE — Brief Op Note (Signed)
09/06/2014  9:07 AM  PATIENT:  Phillip Spencer  54 y.o. male  PRE-OPERATIVE DIAGNOSIS:  RIGHT RENAL STONE, HEMATURIA  POST-OPERATIVE DIAGNOSIS:  RIGHT RENAL STONE, HEMATURIA  PROCEDURE:  Procedure(s): CYSTOSCOPY WITH RETROGRADE PYELOGRAM, RIGHT DIAGNOSTIC URETEROSCOPY AND STENT PLACEMENT (Right)  SURGEON:  Surgeon(s) and Role:    * Sebastian Acheheodore Bolivar Koranda, MD - Primary  PHYSICIAN ASSISTANT:   ASSISTANTS: none   ANESTHESIA:   general  EBL:  Total I/O In: 200 [I.V.:200] Out: -   BLOOD ADMINISTERED:none  DRAINS: none   LOCAL MEDICATIONS USED:  NONE  SPECIMEN:  No Specimen  DISPOSITION OF SPECIMEN:  N/A  COUNTS:  YES  TOURNIQUET:  * No tourniquets in log *  DICTATION: .Other Dictation: Dictation Number [id number]  PLAN OF CARE: Discharge to home after PACU  PATIENT DISPOSITION:  PACU - hemodynamically stable.   Delay start of Pharmacological VTE agent (>24hrs) due to surgical blood loss or risk of bleeding: yes

## 2014-09-06 NOTE — Anesthesia Procedure Notes (Signed)
Procedure Name: LMA Insertion Date/Time: 09/06/2014 8:40 AM Performed by: Norva PavlovALLAWAY, Tyniah Kastens G Pre-anesthesia Checklist: Patient identified, Emergency Drugs available, Suction available and Patient being monitored Patient Re-evaluated:Patient Re-evaluated prior to inductionOxygen Delivery Method: Circle System Utilized Preoxygenation: Pre-oxygenation with 100% oxygen Intubation Type: IV induction Ventilation: Mask ventilation without difficulty LMA: LMA with gastric port inserted LMA Size: 4.0 and 5.0 Number of attempts: 1 Airway Equipment and Method: bite block Placement Confirmation: positive ETCO2 Tube secured with: Tape Dental Injury: Teeth and Oropharynx as per pre-operative assessment

## 2014-09-06 NOTE — H&P (Signed)
Phillip Spencer is an 54 y.o. male.    Chief Complaint: Pre-OP Right Ureteroscopic Stone Manipulation  HPI:   1 - Nephrolithiasis -  07/2014 - Rt 8mm UPJ stone by CT Urogram, SSD 16cm, 820HU, close to L2 vertebrae by scout image.  2 - Prostate Screening- No FHX prostate cancer 2016 - PSA 0.37 (PCP lab), DRE 40gm smooth  3 - Gross Hematuria - pt with on/off episodes of rust-colored urine x few mos. Non-smoker. Limited exposure history (1 year around fabric dyes). CT Urogram 07/2014 with Rt intrarenal stone as per above. Cysto still pending, he adamantly does not want done in office if possible.   4 - Rt flank-groin pain - pt wih wax/wane rt flank and groin pain for several mos, he is quite concerned related to current stone, overall bother at present modest.   PMH sig for obesity, ortho surgery, lap chole, abd hernia repair. His PCP is Phillip Punt MD with [facility].   Today " Phillip Spencer " is seen to proceed with right ureteroscopic stone manipulation for management of his likely intermit ant obstructing UPJ stone and completion of hematuria evaluation. No interval fevers.  Past Medical History  Diagnosis Date  . Hyperlipidemia   . GERD (gastroesophageal reflux disease)   . Pre-diabetes   . Renal calculus, right   . Hematuria     Past Surgical History  Procedure Laterality Date  . Cholecystectomy  2011    2011  . Hernia repair      ventral x 2  . Foot surgery Bilateral   . Knee arthroscopy Right   . Nasal sinus surgery      History reviewed. No pertinent family history. Social History:  reports that he quit smoking about 30 years ago. He does not have any smokeless tobacco history on file. He reports that he drinks about 2.4 oz of alcohol per week. His drug history is not on file.  Allergies:  Allergies  Allergen Reactions  . Amoxicillin Nausea And Vomiting  . Oxycodone Itching    No prescriptions prior to admission    No results found for this or any previous  visit (from the past 48 hour(s)). No results found.  Review of Systems  Constitutional: Negative.  Negative for fever and chills.  HENT: Negative.   Eyes: Negative.   Respiratory: Negative.   Cardiovascular: Negative.   Gastrointestinal: Negative for nausea and vomiting.  Genitourinary: Positive for hematuria and flank pain.  Musculoskeletal: Negative.   Skin: Negative.   Neurological: Negative.   Endo/Heme/Allergies: Negative.   Psychiatric/Behavioral: Negative.     Height 6' (1.829 m), weight 140.161 kg (309 lb). Physical Exam  HENT:  Head: Normocephalic.  Eyes: Pupils are equal, round, and reactive to light.  Neck: Normal range of motion.  Cardiovascular: Normal rate.   Respiratory: Effort normal.  GI: Soft.  Genitourinary:  Mild Rt CVAT  Musculoskeletal: Normal range of motion.  Neurological: He is alert.  Skin: Skin is warm.  Psychiatric: He has a normal mood and affect. His behavior is normal. Judgment and thought content normal.     Assessment/Plan    1 - Nephrolithiasis - Symptomatic and likely source of hematuria and intermitant colic. Will proceed with cysto and Rt URS as outpatient today as planned.   We rediscussed ureteroscopic stone manipulation with basketing and laser-lithotripsy in detail.  We rediscussed risks including bleeding, infection, damage to kidney / ureter  bladder, rarely loss of kidney. We rediscussed anesthetic risks and rare but serious  surgical complications including DVT, PE, MI, and mortality. We specifically readdressed that in 5-10% of cases a staged approach is required with stenting followed by re-attempt ureteroscopy if anatomy unfavorable. The patient voiced understanding and wises to proceed.   2 - Prostate Screening-  UP to date this year.   3 - Gross Hematuria -likely from Rt UPJ stone, still needs cysto to fully characterize, will perform at time of rt ureteroscopy  4 - Rt flank-groin pain - likelky intermitant obstruction from  Rt UPJ area stone.   Phillip Spencer 09/06/2014, 6:27 AM

## 2014-09-06 NOTE — Addendum Note (Signed)
Addendum  created 09/06/14 1329 by Cristela BlueKyle Norine Reddington, MD   Modules edited: Anesthesia Events, Narrator   Narrator:  Narrator: Event Log Edited

## 2014-09-07 ENCOUNTER — Encounter (HOSPITAL_BASED_OUTPATIENT_CLINIC_OR_DEPARTMENT_OTHER): Payer: Self-pay | Admitting: Urology

## 2014-09-07 NOTE — Op Note (Signed)
NAMEHARTMAN, MINAHAN NO.:  [id number]  MEDICAL RECORD NO.:  [id number]  LOCATION:                                 FACILITY:  PHYSICIAN:  Alexis Frock, MD     DATE OF BIRTH:  Oct 27, 1960  DATE OF PROCEDURE:  09/06/2014                               OPERATIVE REPORT   DIAGNOSES:  Right renal stone, gross hematuria.  PROCEDURE: 1. Cystoscopy with right retrograde pyelogram interpretation. 2. Right diagnostic ureteroscopy. 3. Insertion of right ureteral stent, 6 x 26 Contour, no tether.  FINDINGS: 1. Unremarkable urethra and urinary bladder. 2. Right retrograde pyelogram with filling defect of the UPJ     consistent with known stone. 3. Incredibly tight right distal ureter would not easily accommodate 2     wires and ureteroscope, it was felt that safest way to proceed     would be stenting alone to allow for passive dilation with re-     attempt ureteroscopy in several weeks.  DRAINS:  None.  SPECIMEN:  None.  ESTIMATED BLOOD LOSS:  Nil.  INDICATION:  Mr. Plante is a very pleasant and quite robust 54 year old gentleman with recent history of gross hematuria.  He was found on evaluation of this to have an 8 mm right renal stone with questionable ball-valving as he does admit to some periodic right flank pain. Options were discussed for management including surveillance for shockwave lithotripsy versus ureteroscopy with the latter offering concomitant cystoscopy as he adamantly wishes to avoid having cystoscopy when awake.  He therefore wished to proceed with cystoscopy with right ureteroscopic stone manipulation to complete his hematuria evaluation and for definitive management of this right renal stone.  Informed consent was obtained and placed in medical record.  PROCEDURE IN DETAIL:  The patient being Ramello Cordial verified. Procedure being right ureteroscopic stone manipulation was confirmed. Procedure was carried out.  Time-out was performed.   Intravenous antibiotics were administered.  General LMA anesthesia was introduced. The patient was placed into a low lithotomy position.  Sterile field was created by prepping and draping the patient's penis, perineum, and proximal thighs using iodine x3.  Next, cystourethroscopy was performed using a 22-French rigid cystoscope with 30-degree offset lens. Inspection of the anterior and posterior urethra unremarkable. Inspection of the urinary bladder revealed no diverticula, calcifications, or papular lesions.  Ureteral orifices were in normal anatomic position with efflux of clear urine bilaterally.  The right ureteral orifice was cannulated with a 6-French end-hole catheter and right retrograde pyelogram was obtained.  Right retrograde pyelogram demonstrated a single right ureter, single system right kidney.  There was a filling defect near the UPJ without hydronephrosis consistent with known stone.  The collecting system was quite abbreviated and delicate.  A 0.038 zip wire was advanced to the level of the mid pole, set aside as a safety wire.  An 8-French feeding tube was placed in urinary bladder for pressure release.  Next, semi- rigid ureteroscopy was performed of the distal 1/8th of the right ureter alongside a separate Sensor working wire.  The proximal intramural ureter was quite tight and narrow-caliber such that it would not easily accommodate 2 wires and ureteroscope.  A  second wire could not even be placed under endoscopic vision alongside the safety wire.  A single attempt was made to place the dual-lumen introducer over the safety wire to allow passage of a second wire proximally, however, it was also met with some resistance at this same location of the distal ureter.  The safety wire was visualized being in proper position ureteroscopically and radiographically and it was felt that this overall represented very narrow caliber ureter without focal stricturing.  As such  it was felt that the safest way to proceed would be to place a ureteral stent allowing for passive dilation with repeat attempt at ureteroscopy in several weeks.  As such, using cystoscopic guidance, a new 6 x 26, Contour-type stent was carefully placed over the remaining safety wire using cystoscopic and fluoroscopic guidance.  Good proximal and distal deployment were noted.  Efflux of urine was seen around and through the distal end of the stent.  Bladder was emptied per cystoscope.  Procedure was then terminated.  The patient tolerated the procedure well.  There were no immediate periprocedural complications.  The patient was taken to postanesthesia care unit in stable condition.          ______________________________ Alexis Frock, MD     TM/MEDQ  D:  09/06/2014  T:  09/07/2014  Job:  [id number]

## 2014-09-14 ENCOUNTER — Emergency Department (HOSPITAL_COMMUNITY): Payer: PRIVATE HEALTH INSURANCE

## 2014-09-14 ENCOUNTER — Encounter (HOSPITAL_COMMUNITY): Payer: Self-pay | Admitting: Emergency Medicine

## 2014-09-14 ENCOUNTER — Emergency Department (HOSPITAL_COMMUNITY)
Admission: EM | Admit: 2014-09-14 | Discharge: 2014-09-14 | Disposition: A | Discharge status: Home / Self Care | Payer: PRIVATE HEALTH INSURANCE | Attending: Emergency Medicine | Admitting: Emergency Medicine

## 2014-09-14 DIAGNOSIS — Z87442 Personal history of urinary calculi: Secondary | ICD-10-CM | POA: Insufficient documentation

## 2014-09-14 DIAGNOSIS — Z880 Allergy status to penicillin: Secondary | ICD-10-CM | POA: Insufficient documentation

## 2014-09-14 DIAGNOSIS — Z9049 Acquired absence of other specified parts of digestive tract: Secondary | ICD-10-CM | POA: Insufficient documentation

## 2014-09-14 DIAGNOSIS — K219 Gastro-esophageal reflux disease without esophagitis: Secondary | ICD-10-CM | POA: Insufficient documentation

## 2014-09-14 DIAGNOSIS — Z79899 Other long term (current) drug therapy: Secondary | ICD-10-CM | POA: Insufficient documentation

## 2014-09-14 DIAGNOSIS — R52 Pain, unspecified: Secondary | ICD-10-CM

## 2014-09-14 DIAGNOSIS — E785 Hyperlipidemia, unspecified: Secondary | ICD-10-CM | POA: Diagnosis not present

## 2014-09-14 DIAGNOSIS — Z7982 Long term (current) use of aspirin: Secondary | ICD-10-CM | POA: Diagnosis not present

## 2014-09-14 DIAGNOSIS — R109 Unspecified abdominal pain: Secondary | ICD-10-CM | POA: Diagnosis present

## 2014-09-14 DIAGNOSIS — Z87891 Personal history of nicotine dependence: Secondary | ICD-10-CM | POA: Insufficient documentation

## 2014-09-14 DIAGNOSIS — N390 Urinary tract infection, site not specified: Secondary | ICD-10-CM | POA: Diagnosis not present

## 2014-09-14 LAB — CBC WITH DIFFERENTIAL/PLATELET
BASOS ABS: 0 10*3/uL (ref 0.0–0.1)
Basophils Relative: 0 % (ref 0–1)
EOS PCT: 4 % (ref 0–5)
Eosinophils Absolute: 0.4 10*3/uL (ref 0.0–0.7)
HCT: 44 % (ref 39.0–52.0)
HEMOGLOBIN: 14.9 g/dL (ref 13.0–17.0)
LYMPHS PCT: 18 % (ref 12–46)
Lymphs Abs: 1.8 10*3/uL (ref 0.7–4.0)
MCH: 32 pg (ref 26.0–34.0)
MCHC: 33.9 g/dL (ref 30.0–36.0)
MCV: 94.4 fL (ref 78.0–100.0)
MONO ABS: 0.6 10*3/uL (ref 0.1–1.0)
Monocytes Relative: 6 % (ref 3–12)
Neutro Abs: 7.1 10*3/uL (ref 1.7–7.7)
Neutrophils Relative %: 72 % (ref 43–77)
Platelets: 259 10*3/uL (ref 150–400)
RBC: 4.66 MIL/uL (ref 4.22–5.81)
RDW: 12.4 % (ref 11.5–15.5)
WBC: 9.8 10*3/uL (ref 4.0–10.5)

## 2014-09-14 LAB — BASIC METABOLIC PANEL
ANION GAP: 6 (ref 5–15)
BUN: 13 mg/dL (ref 6–23)
CHLORIDE: 105 mmol/L (ref 96–112)
CO2: 25 mmol/L (ref 19–32)
CREATININE: 1.06 mg/dL (ref 0.50–1.35)
Calcium: 8.8 mg/dL (ref 8.4–10.5)
GFR calc Af Amer: >90 mL/min (ref >90)
GFR calc non Af Amer: 78 mL/min — ABNORMAL LOW (ref >90)
Glucose, Bld: 117 mg/dL — ABNORMAL HIGH (ref 70–99)
POTASSIUM: 4 mmol/L (ref 3.5–5.1)
Sodium: 136 mmol/L (ref 135–145)

## 2014-09-14 LAB — URINALYSIS, ROUTINE W REFLEX MICROSCOPIC
Bilirubin Urine: NEGATIVE
Glucose, UA: NEGATIVE mg/dL
KETONES UR: NEGATIVE mg/dL
NITRITE: NEGATIVE
PROTEIN: 100 mg/dL — AB
Specific Gravity, Urine: 1.011 (ref 1.005–1.030)
Urobilinogen, UA: 0.2 mg/dL (ref 0.0–1.0)
pH: 7.5 (ref 5.0–8.0)

## 2014-09-14 LAB — URINE MICROSCOPIC-ADD ON

## 2014-09-14 MED ORDER — SODIUM CHLORIDE 0.9 % IV SOLN
INTRAVENOUS | Status: DC
  Administered 2014-09-14: 09:00:00 via INTRAVENOUS

## 2014-09-14 MED ORDER — HYDROMORPHONE HCL 1 MG/ML IJ SOLN: 2.0000 mg | Freq: Once | INTRAMUSCULAR | Status: DC

## 2014-09-14 MED ORDER — ONDANSETRON HCL 4 MG/2ML IJ SOLN: 4.0000 mg | Freq: Once | INTRAMUSCULAR | Status: DC

## 2014-09-14 MED ORDER — SULFAMETHOXAZOLE-TRIMETHOPRIM 800-160 MG PO TABS: 1.0000 | ORAL_TABLET | Freq: Two times a day (BID) | ORAL | Status: DC

## 2014-09-14 NOTE — Discharge Instructions (Signed)

## 2014-09-14 NOTE — ED Notes (Signed)
Pt states he had a stent placed in rt ureter last Wednesday.  States he has been having pain and hematuria since the procedure. States that he is passing blood clots.

## 2014-09-14 NOTE — ED Provider Notes (Signed)
CSN: [id number]     Arrival date & time 09/14/14  0745 History   First MD Initiated Contact with Patient 09/14/14 [id number]     Chief Complaint  Patient presents with  . Hematuria  . Flank Pain     (Consider location/radiation/quality/duration/timing/severity/associated sxs/prior Treatment) HPI Comments: Patient here complaining of right-sided flank pain that has been persistent since the 26th of last month. He had a stent placed at that time due to kidney stone. Has been having hematuria since then. No fever chills no vomiting noted. Pain is constant and worse with movement. He has been taking hydromorphone at home without relief. He is not passing blood clots which is new for him. Symptoms persistent and nothing makes them worse.  Patient is a 54 y.o. male presenting with hematuria and flank pain. The history is provided by the patient.  Hematuria  Flank Pain    Past Medical History  Diagnosis Date  . Hyperlipidemia   . GERD (gastroesophageal reflux disease)   . Pre-diabetes   . Renal calculus, right   . Hematuria    Past Surgical History  Procedure Laterality Date  . Cholecystectomy  2011    2011  . Hernia repair      ventral x 2  . Foot surgery Bilateral   . Knee arthroscopy Right   . Nasal sinus surgery    . Cystoscopy with retrograde pyelogram, ureteroscopy and stent placement Right 09/06/2014    Procedure: CYSTOSCOPY WITH RETROGRADE PYELOGRAM, RIGHT DIAGNOSTIC URETEROSCOPY AND STENT PLACEMENT;  Surgeon: Sebastian Ache, MD;  Location: [facility];  Service: Urology;  Laterality: Right;   No family history on file. History  Substance Use Topics  . Smoking status: Former Smoker    Quit date: 08/31/1984  . Smokeless tobacco: Not on file  . Alcohol Use: 2.4 oz/week    4 Cans of beer per week    Review of Systems  Genitourinary: Positive for hematuria and flank pain.  All other systems reviewed and are negative.     Allergies  Amoxicillin and  Oxycodone  Home Medications   Prior to Admission medications   Medication Sig Start Date End Date Taking? Authorizing Provider  aspirin 81 MG tablet Take 81 mg by mouth daily.    Historical Provider, MD  beta carotene w/minerals (OCUVITE) tablet Take 1 tablet by mouth daily.    Historical Provider, MD  cholestyramine Lanetta Inch) 4 GM/DOSE powder Take by mouth. One pack at bedtime    Historical Provider, MD  esomeprazole (NEXIUM) 40 MG capsule Take 40 mg by mouth daily before breakfast.    Historical Provider, MD  fenofibrate 160 MG tablet Take 1 tablet (160 mg total) by mouth daily. 06/22/14   Babs Sciara, MD  HYDROmorphone (DILAUDID) 2 MG tablet Take 1 tablet (2 mg total) by mouth every 4 (four) hours as needed for moderate pain or severe pain. Post-operatively 09/06/14   Sebastian Ache, MD  Nutritional Supplements (DHEA PO) Take by mouth.    Historical Provider, MD  Nutritional Supplements (JUICE PLUS FIBRE PO) Take by mouth.    Historical Provider, MD  oxybutynin (DITROPAN) 5 MG tablet Take 1 tablet (5 mg total) by mouth every 8 (eight) hours as needed for bladder spasms. While stent in place. 09/06/14   Sebastian Ache, MD  pravastatin (PRAVACHOL) 10 MG tablet Take 1 tablet (10 mg total) by mouth every evening. 06/22/14 06/22/15  Babs Sciara, MD  senna-docusate (SENOKOT-S) 8.6-50 MG per tablet Take 1 tablet by  mouth 2 (two) times daily. While taking hydromorphone to prevent constipation 09/06/14   Sebastian Acheheodore Manny, MD  tamsulosin (FLOMAX) 0.4 MG CAPS capsule Take 1 capsule (0.4 mg total) by mouth daily. While stent in place to reduce discomfort 09/06/14   Sebastian Acheheodore Manny, MD   BP 142/91 mmHg  Pulse 70  Temp(Src) 98.8 F (37.1 C) (Oral)  Resp 16  SpO2 96% Physical Exam  Constitutional: He is oriented to person, place, and time. He appears well-developed and well-nourished.  Non-toxic appearance. No distress.  HENT:  Head: Normocephalic and atraumatic.  Eyes: Conjunctivae, EOM and lids are  normal. Pupils are equal, round, and reactive to light.  Neck: Normal range of motion. Neck supple. No tracheal deviation present. No thyroid mass present.  Cardiovascular: Normal rate, regular rhythm and normal heart sounds.  Exam reveals no gallop.   No murmur heard. Pulmonary/Chest: Effort normal and breath sounds normal. No stridor. No respiratory distress. He has no decreased breath sounds. He has no wheezes. He has no rhonchi. He has no rales.  Abdominal: Soft. Normal appearance and bowel sounds are normal. He exhibits no distension. There is no tenderness. There is CVA tenderness. There is no rigidity, no rebound and no guarding.  Musculoskeletal: Normal range of motion. He exhibits no edema or tenderness.  Neurological: He is alert and oriented to person, place, and time. He has normal strength. No cranial nerve deficit or sensory deficit. GCS eye subscore is 4. GCS verbal subscore is 5. GCS motor subscore is 6.  Skin: Skin is warm and dry. No abrasion and no rash noted.  Psychiatric: He has a normal mood and affect. His speech is normal and behavior is normal.  Nursing note and vitals reviewed.   ED Course  Procedures (including critical care time) Labs Review Labs Reviewed  URINE CULTURE  URINALYSIS, ROUTINE W REFLEX MICROSCOPIC  CBC WITH DIFFERENTIAL/PLATELET  BASIC METABOLIC PANEL    Imaging Review No results found.   EKG Interpretation None      MDM   Final diagnoses:  Pain    Patient given pain meds here feels better. Spoke with patient's urologist and follow-up arranged. Patient was placed on Bactrim per urology recommendation.    Toy BakerAnthony T Tarika Mckethan, MD 09/14/14 [phone]

## 2014-09-15 LAB — URINE CULTURE
COLONY COUNT: NO GROWTH
CULTURE: NO GROWTH

## 2014-09-21 ENCOUNTER — Encounter (HOSPITAL_BASED_OUTPATIENT_CLINIC_OR_DEPARTMENT_OTHER): Payer: Self-pay | Admitting: *Deleted

## 2014-09-22 ENCOUNTER — Encounter (HOSPITAL_BASED_OUTPATIENT_CLINIC_OR_DEPARTMENT_OTHER): Payer: Self-pay | Admitting: *Deleted

## 2014-09-22 NOTE — Progress Notes (Addendum)
NPO AFTER MN. ARRIVE AT 0700. CURRENT LAB RESULTS IN CHART AND EPIC. WILL TAKE FLOMAX AND NEXIUM AM DOS  W/ SIPS OF WATER AND IF NEEDED TAKE DILAUDID/ ZOFRAN .

## 2014-09-27 ENCOUNTER — Ambulatory Visit (HOSPITAL_BASED_OUTPATIENT_CLINIC_OR_DEPARTMENT_OTHER): Payer: PRIVATE HEALTH INSURANCE | Admitting: Anesthesiology

## 2014-09-27 ENCOUNTER — Ambulatory Visit (HOSPITAL_BASED_OUTPATIENT_CLINIC_OR_DEPARTMENT_OTHER)
Admission: RE | Admit: 2014-09-27 | Discharge: 2014-09-27 | Disposition: A | Discharge status: Home / Self Care | Payer: PRIVATE HEALTH INSURANCE | Source: Ambulatory Visit | Attending: Urology | Admitting: Urology

## 2014-09-27 ENCOUNTER — Encounter (HOSPITAL_BASED_OUTPATIENT_CLINIC_OR_DEPARTMENT_OTHER): Payer: Self-pay | Admitting: Anesthesiology

## 2014-09-27 ENCOUNTER — Encounter (HOSPITAL_BASED_OUTPATIENT_CLINIC_OR_DEPARTMENT_OTHER)
Admission: RE | Disposition: A | Discharge status: Home / Self Care | Payer: Self-pay | Source: Ambulatory Visit | Attending: Urology

## 2014-09-27 DIAGNOSIS — E785 Hyperlipidemia, unspecified: Secondary | ICD-10-CM | POA: Diagnosis not present

## 2014-09-27 DIAGNOSIS — Z881 Allergy status to other antibiotic agents status: Secondary | ICD-10-CM | POA: Insufficient documentation

## 2014-09-27 DIAGNOSIS — Z87891 Personal history of nicotine dependence: Secondary | ICD-10-CM | POA: Diagnosis not present

## 2014-09-27 DIAGNOSIS — N138 Other obstructive and reflux uropathy: Secondary | ICD-10-CM | POA: Diagnosis not present

## 2014-09-27 DIAGNOSIS — N200 Calculus of kidney: Secondary | ICD-10-CM | POA: Diagnosis not present

## 2014-09-27 DIAGNOSIS — Z9049 Acquired absence of other specified parts of digestive tract: Secondary | ICD-10-CM | POA: Insufficient documentation

## 2014-09-27 DIAGNOSIS — K219 Gastro-esophageal reflux disease without esophagitis: Secondary | ICD-10-CM | POA: Diagnosis not present

## 2014-09-27 HISTORY — PX: CYSTOSCOPY WITH RETROGRADE PYELOGRAM, URETEROSCOPY AND STENT PLACEMENT: SHX5789

## 2014-09-27 HISTORY — PX: HOLMIUM LASER APPLICATION: SHX5852

## 2014-09-27 SURGERY — CYSTOURETEROSCOPY, WITH RETROGRADE PYELOGRAM AND STENT INSERTION
Anesthesia: General | Site: Bladder | Laterality: Right

## 2014-09-27 MED ORDER — KETOROLAC TROMETHAMINE 30 MG/ML IJ SOLN
INTRAMUSCULAR | Status: DC | PRN
  Administered 2014-09-27: 30 mg via INTRAVENOUS

## 2014-09-27 MED ORDER — OXYBUTYNIN CHLORIDE 5 MG PO TABS
5.0000 mg | ORAL_TABLET | Freq: Three times a day (TID) | ORAL | Status: DC
  Administered 2014-09-27: 5 mg via ORAL
  Filled 2014-09-27: qty 1

## 2014-09-27 MED ORDER — FENTANYL CITRATE 0.05 MG/ML IJ SOLN
INTRAMUSCULAR | Status: AC
  Filled 2014-09-27: qty 6

## 2014-09-27 MED ORDER — DEXAMETHASONE SODIUM PHOSPHATE 10 MG/ML IJ SOLN
INTRAMUSCULAR | Status: DC | PRN
  Administered 2014-09-27: 10 mg via INTRAVENOUS

## 2014-09-27 MED ORDER — OXYBUTYNIN CHLORIDE 5 MG PO TABS
ORAL_TABLET | ORAL | Status: AC
  Filled 2014-09-27: qty 1

## 2014-09-27 MED ORDER — SODIUM CHLORIDE 0.9 % IR SOLN
Status: DC | PRN
  Administered 2014-09-27: 6000 mL via INTRAVESICAL

## 2014-09-27 MED ORDER — GENTAMICIN IN SALINE 1.6-0.9 MG/ML-% IV SOLN
80.0000 mg | INTRAVENOUS | Status: AC
  Administered 2014-09-27: 520 mg via INTRAVENOUS
  Filled 2014-09-27: qty 50

## 2014-09-27 MED ORDER — FENTANYL CITRATE 0.05 MG/ML IJ SOLN
INTRAMUSCULAR | Status: DC | PRN
  Administered 2014-09-27 (×3): 25 ug via INTRAVENOUS
  Administered 2014-09-27: 50 ug via INTRAVENOUS
  Administered 2014-09-27 (×7): 25 ug via INTRAVENOUS

## 2014-09-27 MED ORDER — HYDROMORPHONE HCL 1 MG/ML IJ SOLN
INTRAMUSCULAR | Status: AC
  Filled 2014-09-27: qty 1

## 2014-09-27 MED ORDER — MIDAZOLAM HCL 5 MG/5ML IJ SOLN
INTRAMUSCULAR | Status: DC | PRN
  Administered 2014-09-27: 2 mg via INTRAVENOUS

## 2014-09-27 MED ORDER — PROPOFOL 10 MG/ML IV BOLUS
INTRAVENOUS | Status: DC | PRN
  Administered 2014-09-27: 300 mg via INTRAVENOUS

## 2014-09-27 MED ORDER — IOHEXOL 350 MG/ML SOLN
INTRAVENOUS | Status: DC | PRN
  Administered 2014-09-27: 15 mL via URETHRAL

## 2014-09-27 MED ORDER — LIDOCAINE HCL (CARDIAC) 20 MG/ML IV SOLN
INTRAVENOUS | Status: DC | PRN
  Administered 2014-09-27: 100 mg via INTRAVENOUS

## 2014-09-27 MED ORDER — LACTATED RINGERS IV SOLN
INTRAVENOUS | Status: DC | PRN
  Administered 2014-09-27 (×2): via INTRAVENOUS

## 2014-09-27 MED ORDER — PHENYLEPHRINE HCL 10 MG/ML IJ SOLN
INTRAMUSCULAR | Status: DC | PRN
  Administered 2014-09-27 (×2): 120 ug via INTRAVENOUS

## 2014-09-27 MED ORDER — ONDANSETRON HCL 4 MG/2ML IJ SOLN
INTRAMUSCULAR | Status: DC | PRN
  Administered 2014-09-27: 4 mg via INTRAVENOUS

## 2014-09-27 MED ORDER — ACETAMINOPHEN 10 MG/ML IV SOLN
INTRAVENOUS | Status: DC | PRN
  Administered 2014-09-27: 1000 mg via INTRAVENOUS

## 2014-09-27 MED ORDER — METOCLOPRAMIDE HCL 5 MG/ML IJ SOLN
INTRAMUSCULAR | Status: DC | PRN
  Administered 2014-09-27: 10 mg via INTRAVENOUS

## 2014-09-27 MED ORDER — HYDROMORPHONE HCL 2 MG PO TABS: 2.0000 mg | ORAL_TABLET | ORAL | Status: DC | PRN

## 2014-09-27 MED ORDER — MIDAZOLAM HCL 2 MG/2ML IJ SOLN
INTRAMUSCULAR | Status: AC
  Filled 2014-09-27: qty 2

## 2014-09-27 MED ORDER — SULFAMETHOXAZOLE-TRIMETHOPRIM 800-160 MG PO TABS: 1.0000 | ORAL_TABLET | Freq: Two times a day (BID) | ORAL | Status: DC

## 2014-09-27 MED ORDER — HYDROMORPHONE HCL 1 MG/ML IJ SOLN
0.2500 mg | INTRAMUSCULAR | Status: DC | PRN
  Administered 2014-09-27 (×3): 0.25 mg via INTRAVENOUS
  Filled 2014-09-27: qty 1

## 2014-09-27 MED ORDER — HYDROMORPHONE HCL 2 MG PO TABS
2.0000 mg | ORAL_TABLET | ORAL | Status: DC | PRN
  Administered 2014-09-27: 2 mg via ORAL
  Filled 2014-09-27: qty 1

## 2014-09-27 MED ORDER — LIDOCAINE HCL 2 % EX GEL
CUTANEOUS | Status: DC | PRN
  Administered 2014-09-27: 1

## 2014-09-27 MED ORDER — STERILE WATER FOR IRRIGATION IR SOLN
Status: DC | PRN
  Administered 2014-09-27: 500 mL

## 2014-09-27 MED ORDER — LACTATED RINGERS IV SOLN
INTRAVENOUS | Status: DC
  Administered 2014-09-27: 09:00:00 via INTRAVENOUS
  Filled 2014-09-27: qty 1000

## 2014-09-27 MED ORDER — HYDROMORPHONE HCL 2 MG PO TABS
ORAL_TABLET | ORAL | Status: AC
  Filled 2014-09-27: qty 1

## 2014-09-27 MED ORDER — ONDANSETRON HCL 4 MG/2ML IJ SOLN
4.0000 mg | Freq: Once | INTRAMUSCULAR | Status: DC | PRN
  Filled 2014-09-27: qty 2

## 2014-09-27 MED ORDER — GLYCOPYRROLATE 0.2 MG/ML IJ SOLN
INTRAMUSCULAR | Status: DC | PRN
  Administered 2014-09-27: 0.2 mg via INTRAVENOUS

## 2014-09-27 SURGICAL SUPPLY — 22 items
BAG DRAIN URO-CYSTO SKYTR STRL (DRAIN) ×3 IMPLANT
BASKET LASER NITINOL 1.9FR (BASKET) ×3 IMPLANT
CANISTER SUCT LVC 12 LTR MEDI- (MISCELLANEOUS) ×3 IMPLANT
CATH INTERMIT  6FR 70CM (CATHETERS) ×3 IMPLANT
CLOTH BEACON ORANGE TIMEOUT ST (SAFETY) ×3 IMPLANT
FIBER LASER FLEXIVA 200 (UROLOGICAL SUPPLIES) ×3 IMPLANT
GLOVE BIO SURGEON STRL SZ7.5 (GLOVE) ×3 IMPLANT
GLOVE BIOGEL PI IND STRL 7.5 (GLOVE) ×1 IMPLANT
GLOVE BIOGEL PI INDICATOR 7.5 (GLOVE) ×2
GLOVE SURG SS PI 7.5 STRL IVOR (GLOVE) ×3 IMPLANT
GOWN STRL REUS W/ TWL LRG LVL3 (GOWN DISPOSABLE) ×1 IMPLANT
GOWN STRL REUS W/ TWL XL LVL3 (GOWN DISPOSABLE) ×1 IMPLANT
GOWN STRL REUS W/TWL LRG LVL3 (GOWN DISPOSABLE) ×2
GOWN STRL REUS W/TWL XL LVL3 (GOWN DISPOSABLE) ×2
GUIDEWIRE ANG ZIPWIRE 038X150 (WIRE) ×3 IMPLANT
GUIDEWIRE STR DUAL SENSOR (WIRE) ×3 IMPLANT
IV NS IRRIG 3000ML ARTHROMATIC (IV SOLUTION) ×6 IMPLANT
PACK CYSTO (CUSTOM PROCEDURE TRAY) ×3 IMPLANT
SHEATH ACCESS URETERAL 38CM (SHEATH) ×3 IMPLANT
STENT POLARIS 5FRX26 (STENTS) ×3 IMPLANT
SYRINGE 10CC LL (SYRINGE) ×3 IMPLANT
TUBE FEEDING 8FR 16IN STR KANG (MISCELLANEOUS) ×3 IMPLANT

## 2014-09-27 NOTE — Anesthesia Preprocedure Evaluation (Signed)
Anesthesia Evaluation  Patient identified by MRN, date of birth, ID band Patient awake    Reviewed: Allergy & Precautions, NPO status , Patient's Chart, lab work & pertinent test results  Airway        Dental   Pulmonary former smoker,          Cardiovascular     Neuro/Psych    GI/Hepatic GERD-  ,  Endo/Other  diabetes, Type obesity  Renal/GU Renal disease     Musculoskeletal   Abdominal   Peds  Hematology   Anesthesia Other Findings   Reproductive/Obstetrics                             Anesthesia Physical Anesthesia Plan  ASA: III  Anesthesia Plan: General   Post-op Pain Management:    Induction: Intravenous  Airway Management Planned: LMA and Oral ETT  Additional Equipment:   Intra-op Plan:   Post-operative Plan: Extubation in OR  Informed Consent: I have reviewed the patients History and Physical, chart, labs and discussed the procedure including the risks, benefits and alternatives for the proposed anesthesia with the patient or authorized representative who has indicated his/her understanding and acceptance.     Plan Discussed with: CRNA, Anesthesiologist and Surgeon  Anesthesia Plan Comments:         Anesthesia Quick Evaluation

## 2014-09-27 NOTE — Brief Op Note (Signed)
09/27/2014  9:34 AM  PATIENT:  Phillip Spencer  54 y.o. male  PRE-OPERATIVE DIAGNOSIS:  RIGHT RENAL STONE, HEMATURIA  POST-OPERATIVE DIAGNOSIS:  RIGHT RENAL STONE, HEMATURIA  PROCEDURE:  Procedure(s): CYSTOSCOPY WITH RETROGRADE PYELOGRAM, RIGHT URETEROSCOPY AND STENT EXCHANGE, LASER LITHOTRIPSY WITH STONE BASKETRY (Right) HOLMIUM LASER APPLICATION (Right)  SURGEON:  Surgeon(s) and Role:    * Sebastian Acheheodore Morrissa Shein, MD - Primary  PHYSICIAN ASSISTANT:   ASSISTANTS: none   ANESTHESIA:   general  EBL:  Total I/O In: 700 [I.V.:700] Out: -   BLOOD ADMINISTERED:none  DRAINS: none   LOCAL MEDICATIONS USED:  NONE  SPECIMEN:  Source of Specimen:  Rt renal stone fragments  DISPOSITION OF SPECIMEN:  [facility] for compositional analysis  COUNTS:  YES  TOURNIQUET:  * No tourniquets in log *  DICTATION: .Other Dictation: Dictation Number [id number]  PLAN OF CARE: Discharge to home after PACU  PATIENT DISPOSITION:  PACU - hemodynamically stable.   Delay start of Pharmacological VTE agent (>24hrs) due to surgical blood loss or risk of bleeding: yes

## 2014-09-27 NOTE — Anesthesia Procedure Notes (Addendum)
Procedure Name: LMA Insertion Date/Time: 09/27/2014 8:39 AM Performed by: Jessica PriestBEESON, Carrolyn Hilmes C Pre-anesthesia Checklist: Patient identified, Emergency Drugs available, Suction available and Patient being monitored Patient Re-evaluated:Patient Re-evaluated prior to inductionOxygen Delivery Method: Circle System Utilized Preoxygenation: Pre-oxygenation with 100% oxygen Intubation Type: IV induction Ventilation: Mask ventilation without difficulty LMA: LMA inserted LMA Size: 5.0 Number of attempts: 1 Airway Equipment and Method: Bite block Placement Confirmation: positive ETCO2 Tube secured with: Tape Dental Injury: Teeth and Oropharynx as per pre-operative assessment

## 2014-09-27 NOTE — H&P (Signed)
Phillip Spencer is an 54 y.o. male.    Chief Complaint: Pre-Op Right Ureteroscopic Stone Manipulation  HPI:   1 - Nephrolithiasis -  07/2014 - Rt 8mm UPJ stone by CT Urogram, SSD 16cm, 820HU, close to L2 vertebrae by scout image. Underwent initial attempt ureteroscopic stone manipulation 09/06/14 at which time his ureter was too narrow to allow ureteroscopic access to stone and therefore stented to allow for passive dilation.   2 - Prostate Screening- No FHX prostate cancer 2016 - PSA 0.37 (PCP lab), DRE 40gm smooth  3 - Gross Hematuria - pt with on/off episodes of rust-colored urine x few mos. Non-smoker. Limited exposure history (1 year around fabric dyes). CT Urogram 07/2014 with Rt intrarenal stone as per above. Cysto still pending, he adamantly does not want done in office if possible.   PMH sig for obesity, ortho surgery, lap chole, abd hernia repair. His PCP is Lilyan Punt MD with [facility].   Today " Phillip Spencer " is seen to proceed with second attempt right ureteroscopic stone manipulation for management of his likely intermit ant obstructing UPJ stone after interval passive dilation with ureteral stent. He has unfortunately tollerated the stent poorly. No interval fevers.  Past Medical History  Diagnosis Date  . Hyperlipidemia   . GERD (gastroesophageal reflux disease)   . Pre-diabetes   . Renal calculus, right   . Hematuria     Past Surgical History  Procedure Laterality Date  . Hernia repair      ventral x 2  . Foot surgery Bilateral   . Knee arthroscopy Right   . Nasal sinus surgery    . Cystoscopy with retrograde pyelogram, ureteroscopy and stent placement Right 09/06/2014    Procedure: CYSTOSCOPY WITH RETROGRADE PYELOGRAM, RIGHT DIAGNOSTIC URETEROSCOPY AND STENT PLACEMENT;  Surgeon: Sebastian Ache, MD;  Location: [facility];  Service: Urology;  Laterality: Right;  . Cholecystectomy  2011    History reviewed. No pertinent family history. Social  History:  reports that he quit smoking about 30 years ago. He does not have any smokeless tobacco history on file. He reports that he drinks about 2.4 oz of alcohol per week. His drug history is not on file.  Allergies:  Allergies  Allergen Reactions  . Amoxicillin Nausea And Vomiting    No prescriptions prior to admission    No results found for this or any previous visit (from the past 48 hour(s)). No results found.  Review of Systems  Constitutional: Negative.   HENT: Negative.   Eyes: Negative.   Respiratory: Negative.   Cardiovascular: Negative.   Gastrointestinal: Negative.   Genitourinary: Negative.        Stent colic symptoms as expected  Musculoskeletal: Negative.   Skin: Negative.   Neurological: Negative.   Endo/Heme/Allergies: Negative.   Psychiatric/Behavioral: Negative.     There were no vitals taken for this visit. Physical Exam  Constitutional: He appears well-developed.  HENT:  Head: Normocephalic.  Eyes: Pupils are equal, round, and reactive to light.  Neck: Normal range of motion.  Cardiovascular: Normal rate.   Respiratory: Effort normal.  GI: Soft.  Genitourinary: Penis normal.  Musculoskeletal: Normal range of motion.  Neurological: He is alert.  Skin: Skin is warm.  Psychiatric: He has a normal mood and affect. His behavior is normal. Judgment and thought content normal.     Assessment/Plan    1 - Nephrolithiasis - Symptomatic and likely source of hematuria and intermitant colic. Will proceed with cysto and Rt  URS as outpatient today as planned.   We rediscussed ureteroscopic stone manipulation with basketing and laser-lithotripsy in detail.  We rediscussed risks including bleeding, infection, damage to kidney / ureter  bladder, rarely loss of kidney. We rediscussed anesthetic risks and rare but serious surgical complications including DVT, PE, MI, and mortality. We specifically readdressed that in 5-10% of cases a staged approach is required  with stenting followed by re-attempt ureteroscopy if anatomy unfavorable. The patient voiced understanding and wises to proceed.   2 - Prostate Screening-  UP to date this year.   3 - Gross Hematuria -likely from Rt UPJ stone.   [name] 09/27/2014, 6:02 AM

## 2014-09-27 NOTE — Transfer of Care (Signed)
Immediate Anesthesia Transfer of Care Note  Patient: Phillip Spencer  Procedure(s) Performed: Procedure(s) (LRB): CYSTOSCOPY WITH RETROGRADE PYELOGRAM, RIGHT URETEROSCOPY AND STENT EXCHANGE, LASER LITHOTRIPSY WITH STONE BASKETRY (Right) HOLMIUM LASER APPLICATION (Right)  Patient Location: PACU  Anesthesia Type: General  Level of Consciousness: awake, sedated, patient cooperative and responds to stimulation  Airway & Oxygen Therapy: Patient Spontanous Breathing and Patient connected to face mask oxygen  Post-op Assessment: Report given to PACU RN, Post -op Vital signs reviewed and stable and Patient moving all extremities  Post vital signs: Reviewed and stable  Complications: No apparent anesthesia complications

## 2014-09-27 NOTE — Anesthesia Postprocedure Evaluation (Signed)
  Anesthesia Post-op Note  Patient: Felisa Bonierharles S Poudrier  Procedure(s) Performed: Procedure(s): CYSTOSCOPY WITH RETROGRADE PYELOGRAM, RIGHT URETEROSCOPY AND STENT EXCHANGE, LASER LITHOTRIPSY WITH STONE BASKETRY (Right) HOLMIUM LASER APPLICATION (Right)  Patient Location: PACU  Anesthesia Type:General  Level of Consciousness: awake, alert , oriented and patient cooperative  Airway and Oxygen Therapy: Patient Spontanous Breathing  Post-op Pain: mild  Post-op Assessment: Post-op Vital signs reviewed, Patient's Cardiovascular Status Stable, Respiratory Function Stable, Patent Airway, No signs of Nausea or vomiting and Pain level controlled  Post-op Vital Signs: stable  Last Vitals:  Filed Vitals:   09/27/14 1015  BP: 144/87  Pulse: 81  Temp:   Resp: 15    Complications: No apparent anesthesia complications

## 2014-09-27 NOTE — Op Note (Signed)
[name]:  Molzahn, Anguel              ACCOUNT NO.:  [id number]  MEDICAL RECORD NO.:  [id number]  LOCATION:                               [facility]  PHYSICIAN:  Sebastian Acheheodore Harlen Danford, MD     DATE OF BIRTH:  [birth year]  DATE OF PROCEDURE: 09/27/2014 DATE OF DISCHARGE:  09/27/2014                              OPERATIVE REPORT   DIAGNOSIS:  Right renal stone at the ureteropelvic junction with intermittent obstruction, hematuria.  PROCEDURE: 1. Cystoscopy with right retrograde pyelogram interpretation. 2. Exchange of right ureteral stent, 5 x 26 Polaris with tether to     dorsum of penis. 3. Right ureteroscopy with laser lithotripsy.  ESTIMATED BLOOD LOSS:  Nil.  COMPLICATIONS:  None.  SPECIMEN:  Right renal stone fragments for compositional analysis.  FINDINGS: 1. Narrow right ureter, but significantly less so after interval     Stenting. No focal strictures.  2. Single UPJ 8 mm stone. 3. Complete removal or ablation of all stone fragments larger than     1/3rd mm following laser lithotripsy and basket extraction. 4. Placement of right ureteral stent, proximal in the renal pelvis,     distal and urinary bladder.  INDICATION:  Mr. Roderic ScarceJewell is a pleasant 54 year old gentleman with history of right intermittent flank pain and gross hematuria.  He was found on evaluation of this to have a likely ball valving 8 mm right UPJ stone. He underwent initial attempt of right ureteroscopic stone manipulation on September 06, 2014, at which point, his ureter was quite narrow especially proximally and did not allow safe passage of the ureteroscope as such it was felt that interval stenting to allow for passive dilation was warranted and he had stent placed at that time.  He now presents for re-attempt after interval passive dilation.  Notably, he had bilateral retrograde pyelogram as previously which completed hematuria evaluation and no additional findings were found.  PROCEDURE IN DETAIL:   The patient being Noralee Stainharles Scott Liszewski verified, procedure being right ureteroscopic stone manipulation was confirmed. Procedure was carried out.  Time-out was performed.  Intravenous antibiotics administered.  LMA anesthesia was introduced.  Patient placed into a low lithotomy position and a sterile field was created by prepping the patient's penis, perineum, proximal thighs using iodine x3. Next, cystourethroscopy was performed using a 22-French rigid cystoscope with 30-degree offset lens.  Inspection of the anterior and posterior urethra unremarkable.  Inspection of urinary bladder revealed no diverticula, calcifications, papular lesions.  Distal end of right ureteral stent was seen in situ.  This was grasped and brought to the level of the urethral meatus, through which a 0.038 Zip wire was advanced slowly at the level of the upper pole.  The stent was exchanged for a 6-French end-hole catheter and right retrograde pyelogram was obtained.  Right retrograde pyelogram demonstrated a single right ureter and single system right kidney.  There was a filling defect at the area of the UPJ consistent with known stone.  The Zip wire was once again advanced at the level of the upper pole, set aside as a safety wire.  Next, an 8- [place]French feeding tube was placed in urinary bladder for pressure release and semi-rigid  ureteroscopy was performed at the distal part of the right ureter alongside a separate Sensor working wire.  No mucosal abnormalities were seen.  The ureter was somewhat narrow in caliber, but did easily accommodate the semi-rigid ureteroscope and this was felt to be significantly improved after interval stenting.  The semi-rigid ureteroscope was then exchanged for the 34 cm 12/14 ureteral access sheath at the level of proximal ureter using continuous fluoroscopic vision.  Next, flexible digital ureteroscopy was performed using the BOA flexible ureteroscope.  This allowed  visualization of the proximal portion of the right ureter and systematic inspection of the right kidney including all calyces x2.  A single dominant calcification was found in the area of the UPJ consistent with known stone.  This appeared to be much too large for simple basketing.  Given the patient's relatively small ureteral caliber, it was felt that predominantly dusting technique would be most advantageous as such, a 200 nanometer laser fiber was used at settings of 0.2 joules and 20 hertz which was able to ablate approximately 70% of the stone volume.  The remaining smaller 5% of the stone was fragmented into pieces, approximately 1-2 mm.  These were then grasped with an escape basket, brought out in their entirety and set aside for compositional analysis.  Repeat systematic inspection of the kidney revealed complete resolution of all stone fragments larger than 1/3rd mm.  No evidence of perforation or injury. The access sheath was removed under continuous ureteroscopic vision.  No mucosal abnormalities were found.  Finally, a new 5 x 26 Polaris stent was placed with remaining safety wire using fluoroscopic guidance.  Good proximal and distal deployment were noted.  The feeding tube was removed from urinary bladder.  Tether was fashioned on the dorsum of the penis. A 10 mL of lidocaine Uro-Jet was instilled per urethra and procedure was terminated.  The patient tolerated procedure well.  There were no immediate periprocedural complications.  Patient was taken to postanesthesia care unit in stable condition.          ______________________________ Sebastian Ache, MD     TM/MEDQ  D:  09/27/2014  T:  09/27/2014  Job:  [id number]

## 2014-09-27 NOTE — Op Note (Deleted)
Phillip Spencer:  Kashani, Cheryl              ACCOUNT NO.:  [id number]  MEDICAL RECORD NO.:  [id number]  LOCATION:                               [facility]  PHYSICIAN:  Sebastian Acheheodore Jennife Zaucha, MD     DATE OF BIRTH:  04-Mar-1961  DATE OF PROCEDURE: DATE OF DISCHARGE:  09/27/2014                              OPERATIVE REPORT   DIAGNOSIS:  Right renal stone at the ureteropelvic junction with intermittent obstruction, hematuria.  PROCEDURE: 1. Cystoscopy with right retrograde pyelogram interpretation. 2. Exchange of right ureteral stent, 5 x 26 Polaris with tether to     dorsum of penis. 3. Right ureteroscopy with laser lithotripsy.  ESTIMATED BLOOD LOSS:  Nil.  COMPLICATIONS:  None.  SPECIMEN:  Right renal stone fragments for compositional analysis.  FINDINGS: 1. Narrow right ureter, but significantly less so after interval     stenting. 2. Single UPJ 8 mm stone. 3. Complete removal or ablation of all stone fragments larger than     1/3rd mm following laser lithotripsy and basket extraction. 4. Placement of right ureteral stent, proximal in the renal pelvis,     distal and urinary bladder.  INDICATION:  Mr. Phillip Spencer is a pleasant 54 year old gentleman with history of right intermittent flank pain and gross hematuria.  He was found on evaluation of this to have a likely ball valving 8 mm right UPJ stone. He underwent initial attempt of right ureteroscopic stone manipulation on September 06, 2014, at which point, his ureter was quite narrow especially proximally and did not allow safe passage of the ureteroscope as such it was felt that interval stenting to allow for passive dilation was warranted and he had stent placed at that time.  He now presents for re-attempt after interval passive dilation.  Notably, he had bilateral retrograde pyelogram as previously which completed hematuria evaluation and no additional findings were found.  PROCEDURE IN DETAIL:  The patient being Phillip Stainharles Scott  Spencer verified, procedure being right ureteroscopic stone manipulation was confirmed. Procedure was carried out.  Time-out was performed.  Intravenous antibiotics administered.  LMA anesthesia was introduced.  Patient placed into a low lithotomy position and a sterile field was created by prepping the patient's penis, perineum, proximal thighs using iodine x3. Next, cystourethroscopy was performed using a 22-French rigid cystoscope with 30-degree offset lens.  Inspection of the anterior and posterior urethra unremarkable.  Inspection of urinary bladder revealed no diverticula, calcifications, papular lesions.  Distal end of right ureteral stent was seen in situ.  This was grasped and brought to the level of the urethral meatus, through which a 0.038 Zip wire was advanced slowly at the level of the upper pole.  The stent was exchanged for a 6-French end-hole catheter and right retrograde pyelogram was obtained.  Right retrograde pyelogram demonstrated a single right ureter and single system right kidney.  There was a filling defect at the area of the UPJ consistent with known stone.  The Zip wire was once again advanced at the level of the upper pole, set aside as a safety wire.  Next, an 8- [place]French feeding tube was placed in urinary bladder for pressure release and semi-rigid ureteroscopy was performed at the  distal part of the right ureter alongside a separate Sensor working wire.  No mucosal abnormalities were seen.  The ureter was somewhat narrow in caliber, but did easily accommodate the semi-rigid ureteroscope and this was felt to be significantly improved after interval stenting.  The semi-rigid ureteroscope was then exchanged for the 34 cm 12/14 ureteral access sheath at the level of proximal ureter using continuous fluoroscopic vision.  Next, flexible digital ureteroscopy was performed using the BOA flexible ureteroscope.  This allowed visualization of the proximal portion of  the right ureter and systematic inspection of the right kidney including all calyces x2.  A single dominant calcification was found in the area of the UPJ consistent with known stone.  This appeared to be much too large for simple basketing.  Given the patient's relatively small ureteral caliber, it was felt that predominantly dusting technique would be most advantageous as such, a 200 nanometer laser fiber was used at settings of 0.2 joules and 20 hertz which was able to ablate approximately 70% of the stone volume.  The remaining smaller 5% of the stone was fragmented into pieces, approximately 1-2 mm.  These were then grasped with an escape basket, brought out in their entirety and set aside for compositional analysis.  Repeat systematic inspection of the kidney revealed complete resolution of all stone fragments larger than 1/3rd mm.  No evidence of perforation or injury. The access sheath was removed under continuous ureteroscopic vision.  No mucosal abnormalities were found.  Finally, a new 5 x 26 Polaris stent was placed with remaining safety wire using fluoroscopic guidance.  Good proximal and distal deployment were noted.  The feeding tube was removed from urinary bladder.  Tether was fashioned on the dorsum of the penis. A 10 mL of lidocaine Uro-Jet was instilled per urethra and procedure was terminated.  The patient tolerated procedure well.  There were no immediate periprocedural complications.  Patient was taken to postanesthesia care unit in stable condition.          ______________________________ Sebastian Ache, MD     TM/MEDQ  D:  09/27/2014  T:  09/27/2014  Job:  [id number]

## 2014-09-27 NOTE — Discharge Instructions (Addendum)
1 - You may have urinary urgency (bladder spasms) and bloody urine on / off with stent in place. This is normal.  2 - Call MD or go to ER for fever >102, severe pain / nausea / vomiting not relieved by medications, or acute change in medical status   Post Anesthesia Home Care Instructions  Activity: Get plenty of rest for the remainder of the day. A responsible adult should stay with you for 24 hours following the procedure.  For the next 24 hours, DO NOT: -Drive a car -Advertising copywriter -Drink alcoholic beverages -Take any medication unless instructed by your physician -Make any legal decisions or sign important papers.  Meals: Start with liquid foods such as gelatin or soup. Progress to regular foods as tolerated. Avoid greasy, spicy, heavy foods. If nausea and/or vomiting occur, drink only clear liquids until the nausea and/or vomiting subsides. Call your physician if vomiting continues.  Special Instructions/Symptoms: Your throat may feel dry or sore from the anesthesia or the breathing tube placed in your throat during surgery. If this causes discomfort, gargle with warm salt water. The discomfort should disappear within 24 hours. [facility] [phone] Post Ureteroscopy With or Without Stent Instructions  Definitions:  Ureter: The duct that transports urine from the kidney to the bladder. Stent:   A plastic hollow tube that is placed into the ureter, from the kidney to the                 bladder to prevent the ureter from swelling shut.  GENERAL INSTRUCTIONS:  Despite the fact that no skin incisions were used, the area around the ureter and bladder is raw and irritated. The stent is a foreign body which will further irritate the bladder wall. This irritation is manifested by increased frequency of urination, both day and night, and by an increase in the urge to urinate. In some, the urge to urinate is present almost always. Sometimes the urge is strong enough  that you may not be able to stop yourself from urinating. The only real cure is to remove the stent and then give time for the bladder wall to heal which can't be done until the danger of the ureter swelling shut has passed, which varies.  You may see some blood in your urine while the stent is in place and a few days afterwards. Do not be alarmed, even if the urine was clear for a while. Get off your feet and drink lots of fluids until clearing occurs. If you start to pass clots or don't improve, call us.  DIET: You may return to your normal diet immediately. Because of the raw surface of your bladder, alcohol, spicy foods, acid type foods and drinks with caffeine may cause irritation or frequency and should be used in moderation. To keep your urine flowing freely and to avoid constipation, drink plenty of fluids during the day ( 8-10 glasses ). Tip: Avoid cranberry juice because it is very acidic.  ACTIVITY: Your physical activity doesn't need to be restricted. However, if you are very active, you may see some blood in your urine. We suggest that you reduce your activity under these circumstances until the bleeding has stopped.  BOWELS: It is important to keep your bowels regular during the postoperative period. Straining with bowel movements can cause bleeding. A bowel movement every other day is reasonable. Use a mild laxative if needed, such as Milk of Magnesia 2-3 tablespoons, or 2 Dulcolax tablets. Call if you continue  to have problems. If you have been taking narcotics for pain, before, during or after your surgery, you may be constipated. Take a laxative if necessary.   MEDICATION: You should resume your pre-surgery medications unless told not to. In addition you will often be given an antibiotic to prevent infection. These should be taken as prescribed until the bottles are finished unless you are having an unusual reaction to one of the drugs.  PROBLEMS YOU SHOULD REPORT TO US:  Fevers  over 100.5 Fahrenheit.  Heavy bleeding, or clots ( See above notes about blood in urine ).  Inability to urinate.  Drug reactions ( hives, rash, nausea, vomiting, diarrhea ).  Severe burning or pain with urination that is not improving.  FOLLOW-UP: You will need a follow-up appointment to monitor your progress. Call for this appointment at the number listed above. Usually the first appointment will be about three to fourteen days after your surgery. MAY REMOVE STENT ON Monday morning at home by pulling on string, blue/white plastic tubing and discarding. Dr. Berneice HeinrichManny is in office Monday if any issues arise.

## 2014-09-28 ENCOUNTER — Encounter (HOSPITAL_BASED_OUTPATIENT_CLINIC_OR_DEPARTMENT_OTHER): Payer: Self-pay | Admitting: Urology

## 2014-11-15 ENCOUNTER — Ambulatory Visit (INDEPENDENT_AMBULATORY_CARE_PROVIDER_SITE_OTHER): Payer: PRIVATE HEALTH INSURANCE | Admitting: Nurse Practitioner

## 2014-11-15 ENCOUNTER — Encounter: Payer: Self-pay | Admitting: Nurse Practitioner

## 2014-11-15 VITALS — BP 120/88 | Temp 98.1°F | Ht 72.0 in | Wt 316.4 lb

## 2014-11-15 DIAGNOSIS — J0110 Acute frontal sinusitis, unspecified: Secondary | ICD-10-CM | POA: Diagnosis not present

## 2014-11-15 MED ORDER — AZITHROMYCIN 250 MG PO TABS: ORAL_TABLET | ORAL | Status: DC

## 2014-11-15 NOTE — Patient Instructions (Signed)
nasacort AQ or rhinocort AQ 

## 2014-11-17 ENCOUNTER — Encounter: Payer: Self-pay | Admitting: Nurse Practitioner

## 2014-11-17 NOTE — Progress Notes (Signed)
Subjective:  Presents complaints of sore throat and headache that began 3 days ago. No fever. Frontal area headache. Very sore throat. Slight runny nose. Minimal cough. No wheezing. No ear pain. Slight pain localized to the left mid tooth area upper and lower. No oral lesions or sores. Taking fluids well. Voiding normal limit.  Objective:   BP 120/88 mmHg  Temp(Src) 98.1 F (36.7 C) (Oral)  Ht 6' (1.829 m)  Wt 316 lb 6.4 oz (143.518 kg)  BMI 42.90 kg/m2 NAD. Alert, oriented. TMs clear effusion bilateral, no erythema. Pharynx minimal erythema, PND noted. Neck supple with mild soft anterior adenopathy. Minimal tenderness with palpation of the buccal mucosa left side, no obvious problems with dentition. No lesions. Lungs clear. Heart regular rate rhythm.  Assessment: Acute frontal sinusitis, recurrence not specified  Plan:  Meds ordered this encounter  Medications  . azithromycin (ZITHROMAX Z-PAK) 250 MG tablet    Sig: Take 2 tablets (500 mg) on  Day 1,  followed by 1 tablet (250 mg) once daily on Days 2 through 5.    Dispense:  6 each    Refill:  0    Order Specific Question:  Supervising Provider    Answer:  Merlyn AlbertLUKING, WILLIAM S [[id number]]   OTC meds as directed for congestion. Dental discomfort may be due to sinus pressure. Recheck with dentist if pain persists. Return if symptoms worsen or fail to improve.

## 2015-01-04 ENCOUNTER — Other Ambulatory Visit: Payer: Self-pay | Admitting: Family Medicine

## 2015-04-01 ENCOUNTER — Other Ambulatory Visit: Payer: Self-pay | Admitting: Family Medicine

## 2015-04-03 ENCOUNTER — Other Ambulatory Visit: Payer: Self-pay | Admitting: *Deleted

## 2015-04-03 ENCOUNTER — Other Ambulatory Visit: Payer: Self-pay | Admitting: Family Medicine

## 2015-04-03 MED ORDER — PRAVASTATIN SODIUM 10 MG PO TABS: 10.0000 mg | ORAL_TABLET | Freq: Every evening | ORAL | Status: DC

## 2015-04-24 ENCOUNTER — Telehealth: Payer: Self-pay | Admitting: Family Medicine

## 2015-04-24 ENCOUNTER — Ambulatory Visit (INDEPENDENT_AMBULATORY_CARE_PROVIDER_SITE_OTHER): Payer: PRIVATE HEALTH INSURANCE | Admitting: Family Medicine

## 2015-04-24 VITALS — BP 134/86

## 2015-04-24 DIAGNOSIS — J0110 Acute frontal sinusitis, unspecified: Secondary | ICD-10-CM | POA: Diagnosis not present

## 2015-04-24 DIAGNOSIS — R7303 Prediabetes: Secondary | ICD-10-CM

## 2015-04-24 DIAGNOSIS — H6502 Acute serous otitis media, left ear: Secondary | ICD-10-CM | POA: Diagnosis not present

## 2015-04-24 DIAGNOSIS — Z125 Encounter for screening for malignant neoplasm of prostate: Secondary | ICD-10-CM

## 2015-04-24 DIAGNOSIS — E785 Hyperlipidemia, unspecified: Secondary | ICD-10-CM

## 2015-04-24 DIAGNOSIS — Z79899 Other long term (current) drug therapy: Secondary | ICD-10-CM

## 2015-04-24 MED ORDER — CEFPROZIL 500 MG PO TABS: 500.0000 mg | ORAL_TABLET | Freq: Two times a day (BID) | ORAL | Status: DC

## 2015-04-24 NOTE — Telephone Encounter (Signed)
Pt is requesting lab orders to be sent over for his upcoming appt. Last labs per epic were: lipid,a1c,psa,and bmp on 07/04/14

## 2015-04-24 NOTE — Progress Notes (Signed)
   Subjective:    Patient ID: Phillip Spencer, male    DOB: 12-19-1960, 54 y.o.   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 Next  Muffled sensation left ear. Next  Slight ringing in left ear. With somewhat diminished hearing.   Review of Systems No chest pain no abdominal pain no shortness of breath    Objective:   Physical Exam  Alert vitals stable. Lungs clear. Heart rare rhythm. HEENT frontal congestion. TMs normal pharynx normal neck supple lungs clear heart regular in rhythm.      Assessment & Plan:  Impression potential subacute sinusitis involvement station tube dysfunction plan trial of antibiotics symptom care discussed the persists would recommend ENT discussed WSL

## 2015-04-24 NOTE — Telephone Encounter (Signed)
I recommend lipid, liver, metabolic 7, hemoglobin A1c, PSA, urine ACR-hyperlipidemia, prediabetes, screening-

## 2015-04-25 NOTE — Telephone Encounter (Signed)
Notified patient that bloodwork has been ordered.  

## 2015-05-12 LAB — HEMOGLOBIN A1C
ESTIMATED AVERAGE GLUCOSE: 120 mg/dL
Hgb A1c MFr Bld: 5.8 % — ABNORMAL HIGH (ref 4.8–5.6)

## 2015-05-12 LAB — PSA: PROSTATE SPECIFIC AG, SERUM: 0.3 ng/mL (ref 0.0–4.0)

## 2015-05-12 LAB — MICROALBUMIN / CREATININE URINE RATIO
Creatinine, Urine: 207.1 mg/dL
MICROALB/CREAT RATIO: 6.5 mg/g creat (ref 0.0–30.0)
MICROALBUM., U, RANDOM: 13.5 ug/mL

## 2015-05-12 LAB — LIPID PANEL
CHOL/HDL RATIO: 3.9 ratio (ref 0.0–5.0)
Cholesterol, Total: 201 mg/dL — ABNORMAL HIGH (ref 100–199)
HDL: 52 mg/dL (ref >39)
LDL Calculated: 112 mg/dL — ABNORMAL HIGH (ref 0–99)
Triglycerides: 185 mg/dL — ABNORMAL HIGH (ref 0–149)
VLDL CHOLESTEROL CAL: 37 mg/dL (ref 5–40)

## 2015-05-12 LAB — HEPATIC FUNCTION PANEL
ALBUMIN: 4.3 g/dL (ref 3.5–5.5)
ALK PHOS: 103 IU/L (ref 39–117)
ALT: 37 IU/L (ref 0–44)
AST: 27 IU/L (ref 0–40)
Bilirubin Total: 0.5 mg/dL (ref 0.0–1.2)
Bilirubin, Direct: 0.12 mg/dL (ref 0.00–0.40)
Total Protein: 6.7 g/dL (ref 6.0–8.5)

## 2015-05-12 LAB — BASIC METABOLIC PANEL
BUN/Creatinine Ratio: 16 (ref 9–20)
BUN: 15 mg/dL (ref 6–24)
CALCIUM: 9.3 mg/dL (ref 8.7–10.2)
CO2: 23 mmol/L (ref 18–29)
CREATININE: 0.94 mg/dL (ref 0.76–1.27)
Chloride: 102 mmol/L (ref 97–106)
GFR calc Af Amer: 107 mL/min/{1.73_m2} (ref >59)
GFR calc non Af Amer: 92 mL/min/{1.73_m2} (ref >59)
Glucose: 97 mg/dL (ref 65–99)
Potassium: 4.5 mmol/L (ref 3.5–5.2)
SODIUM: 141 mmol/L (ref 136–144)

## 2015-05-16 ENCOUNTER — Encounter: Payer: Self-pay | Admitting: Family Medicine

## 2015-05-16 ENCOUNTER — Ambulatory Visit (INDEPENDENT_AMBULATORY_CARE_PROVIDER_SITE_OTHER): Payer: PRIVATE HEALTH INSURANCE | Admitting: Family Medicine

## 2015-05-16 VITALS — BP 122/84 | Ht 72.0 in | Wt 290.2 lb

## 2015-05-16 DIAGNOSIS — E781 Pure hyperglyceridemia: Secondary | ICD-10-CM | POA: Diagnosis not present

## 2015-05-16 DIAGNOSIS — E785 Hyperlipidemia, unspecified: Secondary | ICD-10-CM | POA: Diagnosis not present

## 2015-05-16 DIAGNOSIS — R7303 Prediabetes: Secondary | ICD-10-CM | POA: Diagnosis not present

## 2015-05-16 DIAGNOSIS — K219 Gastro-esophageal reflux disease without esophagitis: Secondary | ICD-10-CM

## 2015-05-16 MED ORDER — PRAVASTATIN SODIUM 20 MG PO TABS: 20.0000 mg | ORAL_TABLET | Freq: Every evening | ORAL | Status: DC

## 2015-05-16 NOTE — Progress Notes (Signed)
   Subjective:    Patient ID: Phillip Spencer, male    DOB: 25-Aug-1960, 54 y.o.   MRN: [id number]  Hyperlipidemia This is a chronic problem. The current episode started more than 1 year ago. Pertinent negatives include no chest pain. Treatments tried: pravastatin, fenofibrate. There are no compliance problems.  Risk factors for coronary artery disease include dyslipidemia.   Recent kidney stone and stent , it was a calcium oxalate stone we discussed that today. Recommended plenty of fluids. If further stones may need consideration of medication he is under the care of neurology Discuss recent labs  patient's lab work was discussed with them.  he has history hyperglycemia prediabetes he's been try to watch starches in diet avoid all be in exercise has been losing weight  patient also taking his cholesterol medicine without any complications   patient does relate reflux overall under good control. Tasted abnormal regular basis.  Does take 81 mg aspirin daily but does not have any bleeding issues with stomach pains. Review of Systems  Constitutional: Negative for activity change, appetite change and fatigue.  HENT: Negative for congestion.   Respiratory: Negative for cough.   Cardiovascular: Negative for chest pain.  Gastrointestinal: Negative for abdominal pain.  Endocrine: Negative for polydipsia and polyphagia.  Neurological: Negative for weakness.  Psychiatric/Behavioral: Negative for confusion.       Objective:   Physical Exam  Constitutional: He appears well-nourished. No distress.  Cardiovascular: Normal rate, regular rhythm and normal heart sounds.   No murmur heard. Pulmonary/Chest: Effort normal and breath sounds normal. No respiratory distress.  Musculoskeletal: He exhibits no edema.  Lymphadenopathy:    He has no cervical adenopathy.  Neurological: He is alert.  Psychiatric: His behavior is normal.  Vitals reviewed.    25 minutes was spent with the patient. Greater  than half the time was spent in discussion and answering questions and counseling regarding the issues that the patient came in for today.      Assessment & Plan:   hyperlipidemia- needed increase pravastatin get LDL below 100. Triglycerides looks good continue current measures. In terms of diet. Stop fenofibrate. Recheck lipid liver profile in approximately months. Wellness in December  prediabetes much better control.  Obesity patient is losing weight watching his diet he is avoiding all week products. States he feels very good  as for reflux it was recommended for the patient to taper down on Nexium if he can tolerate this him potentially come off of it.

## 2015-06-29 ENCOUNTER — Encounter: Payer: PRIVATE HEALTH INSURANCE | Admitting: Family Medicine

## 2015-07-12 ENCOUNTER — Ambulatory Visit (INDEPENDENT_AMBULATORY_CARE_PROVIDER_SITE_OTHER): Payer: PRIVATE HEALTH INSURANCE | Admitting: Family Medicine

## 2015-07-12 ENCOUNTER — Encounter: Payer: Self-pay | Admitting: Family Medicine

## 2015-07-12 VITALS — BP 122/80 | Ht 70.5 in | Wt 292.0 lb

## 2015-07-12 DIAGNOSIS — Z0000 Encounter for general adult medical examination without abnormal findings: Secondary | ICD-10-CM

## 2015-07-12 MED ORDER — KETOCONAZOLE 2 % EX CREA: 1.0000 "application " | TOPICAL_CREAM | Freq: Two times a day (BID) | CUTANEOUS | Status: DC

## 2015-07-12 NOTE — Progress Notes (Signed)
   Subjective:    Patient ID: Phillip Spencer, male    DOB: July 12, 1961, 54 y.o.   MRN: [id number]  HPI  The patient comes in today for a wellness visit.    A review of their health history was completed.  A review of medications was also completed.  Any needed refills; no  Eating habits: eating more healthy- low carb  Falls/  MVA accidents in past few months: no  Regular exercise: walking  Specialist pt sees on regular basis: Dr Lanetta InchSolik-[facility]  Preventative health issues were discussed.   Additional concerns: none      Review of Systems  Constitutional: Negative for fever, activity change and appetite change.  HENT: Negative for congestion and rhinorrhea.   Eyes: Negative for discharge.  Respiratory: Negative for cough and wheezing.   Cardiovascular: Negative for chest pain.  Gastrointestinal: Negative for vomiting, abdominal pain and blood in stool.  Genitourinary: Negative for frequency and difficulty urinating.  Musculoskeletal: Negative for neck pain.  Skin: Negative for rash.  Allergic/Immunologic: Negative for environmental allergies and food allergies.  Neurological: Negative for weakness and headaches.  Psychiatric/Behavioral: Negative for agitation.       Objective:   Physical Exam  Constitutional: He appears well-developed and well-nourished.  HENT:  Head: Normocephalic and atraumatic.  Right Ear: External ear normal.  Left Ear: External ear normal.  Nose: Nose normal.  Mouth/Throat: Oropharynx is clear and moist.  Eyes: EOM are normal. Pupils are equal, round, and reactive to light.  Neck: Normal range of motion. Neck supple. No thyromegaly present.  Cardiovascular: Normal rate, regular rhythm and normal heart sounds.   No murmur heard. Pulmonary/Chest: Effort normal and breath sounds normal. No respiratory distress. He has no wheezes.  Abdominal: Soft. Bowel sounds are normal. He exhibits no distension and no mass. There is no tenderness.    Genitourinary: Penis normal.  Musculoskeletal: Normal range of motion. He exhibits no edema.  Lymphadenopathy:    He has no cervical adenopathy.  Neurological: He is alert. He exhibits normal muscle tone.  Skin: Skin is warm and dry. No erythema.  Psychiatric: He has a normal mood and affect. His behavior is normal. Judgment normal.          Assessment & Plan:   safety dietary discussed in detail Importance of regular physical activity watching diet losing weight  patient states he has lost from 320 pounds down to 92. He is encouraged to continue to lose weight He is up-to-date on standard health issues including colonoscopy Prostatectomy today and normal exam Follow-up in 6 months Lipid profile in a few weeks time continue medication

## 2015-07-12 NOTE — Patient Instructions (Signed)
DASH Eating Plan  DASH stands for "Dietary Approaches to Stop Hypertension." The DASH eating plan is a healthy eating plan that has been shown to reduce high blood pressure (hypertension). Additional health benefits may include reducing the risk of type 2 diabetes mellitus, heart disease, and stroke. The DASH eating plan may also help with weight loss.  WHAT DO I NEED TO KNOW ABOUT THE DASH EATING PLAN?  For the DASH eating plan, you will follow these general guidelines:  · Choose foods with a percent daily value for sodium of less than 5% (as listed on the food label).  · Use salt-free seasonings or herbs instead of table salt or sea salt.  · Check with your health care provider or pharmacist before using salt substitutes.  · Eat lower-sodium products, often labeled as "lower sodium" or "no salt added."  · Eat fresh foods.  · Eat more vegetables, fruits, and low-fat dairy products.  · Choose whole grains. Look for the word "whole" as the first word in the ingredient list.  · Choose fish and skinless chicken or turkey more often than red meat. Limit fish, poultry, and meat to 6 oz (170 g) each day.  · Limit sweets, desserts, sugars, and sugary drinks.  · Choose heart-healthy fats.  · Limit cheese to 1 oz (28 g) per day.  · Eat more home-cooked food and less restaurant, buffet, and fast food.  · Limit fried foods.  · Cook foods using methods other than frying.  · Limit canned vegetables. If you do use them, rinse them well to decrease the sodium.  · When eating at a restaurant, ask that your food be prepared with less salt, or no salt if possible.  WHAT FOODS CAN I EAT?  Seek help from a dietitian for individual calorie needs.  Grains  Whole grain or whole wheat bread. Brown rice. Whole grain or whole wheat pasta. Quinoa, bulgur, and whole grain cereals. Low-sodium cereals. Corn or whole wheat flour tortillas. Whole grain cornbread. Whole grain crackers. Low-sodium crackers.  Vegetables  Fresh or frozen vegetables  (raw, steamed, roasted, or grilled). Low-sodium or reduced-sodium tomato and vegetable juices. Low-sodium or reduced-sodium tomato sauce and paste. Low-sodium or reduced-sodium canned vegetables.   Fruits  All fresh, canned (in natural juice), or frozen fruits.  Meat and Other Protein Products  Ground beef (85% or leaner), grass-fed beef, or beef trimmed of fat. Skinless chicken or turkey. Ground chicken or turkey. Pork trimmed of fat. All fish and seafood. Eggs. Dried beans, peas, or lentils. Unsalted nuts and seeds. Unsalted canned beans.  Dairy  Low-fat dairy products, such as skim or 1% milk, 2% or reduced-fat cheeses, low-fat ricotta or cottage cheese, or plain low-fat yogurt. Low-sodium or reduced-sodium cheeses.  Fats and Oils  Tub margarines without trans fats. Light or reduced-fat mayonnaise and salad dressings (reduced sodium). Avocado. Safflower, olive, or canola oils. Natural peanut or almond butter.  Other  Unsalted popcorn and pretzels.  The items listed above may not be a complete list of recommended foods or beverages. Contact your dietitian for more options.  WHAT FOODS ARE NOT RECOMMENDED?  Grains  White bread. White pasta. White rice. Refined cornbread. Bagels and croissants. Crackers that contain trans fat.  Vegetables  Creamed or fried vegetables. Vegetables in a cheese sauce. Regular canned vegetables. Regular canned tomato sauce and paste. Regular tomato and vegetable juices.  Fruits  Dried fruits. Canned fruit in light or heavy syrup. Fruit juice.  Meat and Other Protein   Products  Fatty cuts of meat. Ribs, chicken wings, bacon, sausage, bologna, salami, chitterlings, fatback, hot dogs, bratwurst, and packaged luncheon meats. Salted nuts and seeds. Canned beans with salt.  Dairy  Whole or 2% milk, cream, half-and-half, and cream cheese. Whole-fat or sweetened yogurt. Full-fat cheeses or blue cheese. Nondairy creamers and whipped toppings. Processed cheese, cheese spreads, or cheese  curds.  Condiments  Onion and garlic salt, seasoned salt, table salt, and sea salt. Canned and packaged gravies. Worcestershire sauce. Tartar sauce. Barbecue sauce. Teriyaki sauce. Soy sauce, including reduced sodium. Steak sauce. Fish sauce. Oyster sauce. Cocktail sauce. Horseradish. Ketchup and mustard. Meat flavorings and tenderizers. Bouillon cubes. Hot sauce. Tabasco sauce. Marinades. Taco seasonings. Relishes.  Fats and Oils  Butter, stick margarine, lard, shortening, ghee, and bacon fat. Coconut, palm kernel, or palm oils. Regular salad dressings.  Other  Pickles and olives. Salted popcorn and pretzels.  The items listed above may not be a complete list of foods and beverages to avoid. Contact your dietitian for more information.  WHERE CAN I FIND MORE INFORMATION?  National [employer], [employer], [employer]: www.nhlbi.nih.gov/health/health-topics/topics/dash/     This information is not intended to replace advice given to you by your health care provider. Make sure you discuss any questions you have with your health care provider.     Document Released: 06/19/2011 Document Revised: 07/21/2014 Document Reviewed: 05/04/2013  Elsevier Interactive Patient Education ©2016 [employer]

## 2015-09-15 LAB — HEPATIC FUNCTION PANEL
ALT: 42 IU/L (ref 0–44)
AST: 29 IU/L (ref 0–40)
Albumin: 4.6 g/dL (ref 3.5–5.5)
Alkaline Phosphatase: 108 IU/L (ref 39–117)
BILIRUBIN TOTAL: 0.5 mg/dL (ref 0.0–1.2)
BILIRUBIN, DIRECT: 0.13 mg/dL (ref 0.00–0.40)
TOTAL PROTEIN: 7.1 g/dL (ref 6.0–8.5)

## 2015-09-15 LAB — LIPID PANEL
CHOL/HDL RATIO: 3.7 ratio (ref 0.0–5.0)
Cholesterol, Total: 221 mg/dL — ABNORMAL HIGH (ref 100–199)
HDL: 60 mg/dL (ref >39)
LDL Calculated: 136 mg/dL — ABNORMAL HIGH (ref 0–99)
Triglycerides: 126 mg/dL (ref 0–149)
VLDL Cholesterol Cal: 25 mg/dL (ref 5–40)

## 2015-09-18 MED ORDER — PRAVASTATIN SODIUM 40 MG PO TABS: 40.0000 mg | ORAL_TABLET | Freq: Every day | ORAL | Status: DC

## 2015-09-18 NOTE — Addendum Note (Signed)
Addended by: Jeralene PetersREWS, Zuri Lascala R on: 09/18/2015 01:48 PM   Modules accepted: Orders

## 2015-11-16 ENCOUNTER — Other Ambulatory Visit: Payer: Self-pay | Admitting: Family Medicine

## 2016-01-09 ENCOUNTER — Ambulatory Visit: Payer: PRIVATE HEALTH INSURANCE | Admitting: Family Medicine

## 2016-02-01 LAB — LIPID PANEL
Chol/HDL Ratio: 4 ratio units (ref 0.0–5.0)
Cholesterol, Total: 243 mg/dL — ABNORMAL HIGH (ref 100–199)
HDL: 61 mg/dL (ref >39)
LDL Calculated: 128 mg/dL — ABNORMAL HIGH (ref 0–99)
Triglycerides: 270 mg/dL — ABNORMAL HIGH (ref 0–149)
VLDL Cholesterol Cal: 54 mg/dL — ABNORMAL HIGH (ref 5–40)

## 2016-02-01 LAB — HEPATIC FUNCTION PANEL
ALBUMIN: 4.3 g/dL (ref 3.5–5.5)
ALK PHOS: 98 IU/L (ref 39–117)
ALT: 45 IU/L — ABNORMAL HIGH (ref 0–44)
AST: 32 IU/L (ref 0–40)
BILIRUBIN TOTAL: 0.8 mg/dL (ref 0.0–1.2)
BILIRUBIN, DIRECT: 0.2 mg/dL (ref 0.00–0.40)
Total Protein: 6.8 g/dL (ref 6.0–8.5)

## 2016-02-05 ENCOUNTER — Ambulatory Visit (INDEPENDENT_AMBULATORY_CARE_PROVIDER_SITE_OTHER): Payer: PRIVATE HEALTH INSURANCE | Admitting: Family Medicine

## 2016-02-05 ENCOUNTER — Encounter: Payer: Self-pay | Admitting: Family Medicine

## 2016-02-05 VITALS — BP 126/90 | Ht 70.5 in | Wt 295.0 lb

## 2016-02-05 DIAGNOSIS — E785 Hyperlipidemia, unspecified: Secondary | ICD-10-CM

## 2016-02-05 DIAGNOSIS — R7303 Prediabetes: Secondary | ICD-10-CM | POA: Diagnosis not present

## 2016-02-05 DIAGNOSIS — E781 Pure hyperglyceridemia: Secondary | ICD-10-CM | POA: Diagnosis not present

## 2016-02-05 DIAGNOSIS — Z79899 Other long term (current) drug therapy: Secondary | ICD-10-CM | POA: Diagnosis not present

## 2016-02-05 DIAGNOSIS — Z125 Encounter for screening for malignant neoplasm of prostate: Secondary | ICD-10-CM | POA: Diagnosis not present

## 2016-02-05 MED ORDER — PRAVASTATIN SODIUM 80 MG PO TABS: 80.0000 mg | ORAL_TABLET | Freq: Every day | ORAL | 1 refills | Status: DC

## 2016-02-05 NOTE — Patient Instructions (Signed)
DASH Eating Plan  DASH stands for "Dietary Approaches to Stop Hypertension." The DASH eating plan is a healthy eating plan that has been shown to reduce high blood pressure (hypertension). Additional health benefits may include reducing the risk of type 2 diabetes mellitus, heart disease, and stroke. The DASH eating plan may also help with weight loss.  WHAT DO I NEED TO KNOW ABOUT THE DASH EATING PLAN?  For the DASH eating plan, you will follow these general guidelines:  · Choose foods with a percent daily value for sodium of less than 5% (as listed on the food label).  · Use salt-free seasonings or herbs instead of table salt or sea salt.  · Check with your health care provider or pharmacist before using salt substitutes.  · Eat lower-sodium products, often labeled as "lower sodium" or "no salt added."  · Eat fresh foods.  · Eat more vegetables, fruits, and low-fat dairy products.  · Choose whole grains. Look for the word "whole" as the first word in the ingredient list.  · Choose fish and skinless chicken or turkey more often than red meat. Limit fish, poultry, and meat to 6 oz (170 g) each day.  · Limit sweets, desserts, sugars, and sugary drinks.  · Choose heart-healthy fats.  · Limit cheese to 1 oz (28 g) per day.  · Eat more home-cooked food and less restaurant, buffet, and fast food.  · Limit fried foods.  · Cook foods using methods other than frying.  · Limit canned vegetables. If you do use them, rinse them well to decrease the sodium.  · When eating at a restaurant, ask that your food be prepared with less salt, or no salt if possible.  WHAT FOODS CAN I EAT?  Seek help from a dietitian for individual calorie needs.  Grains  Whole grain or whole wheat bread. Brown rice. Whole grain or whole wheat pasta. Quinoa, bulgur, and whole grain cereals. Low-sodium cereals. Corn or whole wheat flour tortillas. Whole grain cornbread. Whole grain crackers. Low-sodium crackers.  Vegetables  Fresh or frozen vegetables  (raw, steamed, roasted, or grilled). Low-sodium or reduced-sodium tomato and vegetable juices. Low-sodium or reduced-sodium tomato sauce and paste. Low-sodium or reduced-sodium canned vegetables.   Fruits  All fresh, canned (in natural juice), or frozen fruits.  Meat and Other Protein Products  Ground beef (85% or leaner), grass-fed beef, or beef trimmed of fat. Skinless chicken or turkey. Ground chicken or turkey. Pork trimmed of fat. All fish and seafood. Eggs. Dried beans, peas, or lentils. Unsalted nuts and seeds. Unsalted canned beans.  Dairy  Low-fat dairy products, such as skim or 1% milk, 2% or reduced-fat cheeses, low-fat ricotta or cottage cheese, or plain low-fat yogurt. Low-sodium or reduced-sodium cheeses.  Fats and Oils  Tub margarines without trans fats. Light or reduced-fat mayonnaise and salad dressings (reduced sodium). Avocado. Safflower, olive, or canola oils. Natural peanut or almond butter.  Other  Unsalted popcorn and pretzels.  The items listed above may not be a complete list of recommended foods or beverages. Contact your dietitian for more options.  WHAT FOODS ARE NOT RECOMMENDED?  Grains  White bread. White pasta. White rice. Refined cornbread. Bagels and croissants. Crackers that contain trans fat.  Vegetables  Creamed or fried vegetables. Vegetables in a cheese sauce. Regular canned vegetables. Regular canned tomato sauce and paste. Regular tomato and vegetable juices.  Fruits  Dried fruits. Canned fruit in light or heavy syrup. Fruit juice.  Meat and Other Protein   Products  Fatty cuts of meat. Ribs, chicken wings, bacon, sausage, bologna, salami, chitterlings, fatback, hot dogs, bratwurst, and packaged luncheon meats. Salted nuts and seeds. Canned beans with salt.  Dairy  Whole or 2% milk, cream, half-and-half, and cream cheese. Whole-fat or sweetened yogurt. Full-fat cheeses or blue cheese. Nondairy creamers and whipped toppings. Processed cheese, cheese spreads, or cheese  curds.  Condiments  Onion and garlic salt, seasoned salt, table salt, and sea salt. Canned and packaged gravies. Worcestershire sauce. Tartar sauce. Barbecue sauce. Teriyaki sauce. Soy sauce, including reduced sodium. Steak sauce. Fish sauce. Oyster sauce. Cocktail sauce. Horseradish. Ketchup and mustard. Meat flavorings and tenderizers. Bouillon cubes. Hot sauce. Tabasco sauce. Marinades. Taco seasonings. Relishes.  Fats and Oils  Butter, stick margarine, lard, shortening, ghee, and bacon fat. Coconut, palm kernel, or palm oils. Regular salad dressings.  Other  Pickles and olives. Salted popcorn and pretzels.  The items listed above may not be a complete list of foods and beverages to avoid. Contact your dietitian for more information.  WHERE CAN I FIND MORE INFORMATION?  National [employer], [employer], [employer]: www.nhlbi.nih.gov/health/health-topics/topics/dash/     This information is not intended to replace advice given to you by your health care provider. Make sure you discuss any questions you have with your health care provider.     Document Released: 06/19/2011 Document Revised: 07/21/2014 Document Reviewed: 05/04/2013  Elsevier Interactive Patient Education ©2016 [employer]

## 2016-02-05 NOTE — Progress Notes (Signed)
   Subjective:    Patient ID: Phillip Spencer, male    DOB: May 19, 1961, 55 y.o.   MRN: [id number]  Hyperlipidemia  This is a chronic problem. The current episode started more than 1 year ago. Pertinent negatives include no chest pain. Treatments tried: pravastatin. Compliance problems: trying to do some exercise, has not done anything in a few months but getting back on track now.    meeting with health coach every 4 weeks.  hyperlipidemia-previous lab work reviewed with patient current lab work reviewed the patient patient not where he needs to be patient needs to do a better job watching diet we will be bumping up the dose of the medicine He does take his reflux medicine he does state it does a good job help keeping things under control He is tolerating 81 mg aspirin without any signs of any bleeding Pt states no concerns today.  Patient under some stress regarding her home issue with a neighbor  Patient states he is try to work on his weight try to lose weight he knows that he's not been doing a good job watching his diet or exercising  Review of Systems  Constitutional: Negative for activity change, appetite change and fatigue.  HENT: Negative for congestion.   Respiratory: Negative for cough.   Cardiovascular: Negative for chest pain.  Gastrointestinal: Negative for abdominal pain.  Endocrine: Negative for polydipsia and polyphagia.  Neurological: Negative for weakness.  Psychiatric/Behavioral: Negative for confusion.       Objective:   Physical Exam  Constitutional: He appears well-nourished. No distress.  Cardiovascular: Normal rate, regular rhythm and normal heart sounds.   No murmur heard. Pulmonary/Chest: Effort normal and breath sounds normal. No respiratory distress.  Musculoskeletal: He exhibits no edema.  Lymphadenopathy:    He has no cervical adenopathy.  Neurological: He is alert.  Psychiatric: His behavior is normal.  Vitals reviewed.     25 minutes was  spent with the patient. Greater than half the time was spent in discussion and answering questions and counseling regarding the issues that the patient came in for today.     Assessment & Plan:  Morbid obesity the importance of losing weight to lessen the risk of long-term health issues including premature death was discussed in detail importance of watching diet staying physically active  Hyperlipidemia subpar control increase dose of medicine recheck lab work again in several months if not at goal we will need to switch medications to potentially Lipitor or Crestor  Prediabetes fair control importance of diet losing weight at least 20 pounds over the next several months recommended to the patient  Screening lab work for prostate patient will be following up for his wellness within a few months

## 2016-02-09 ENCOUNTER — Ambulatory Visit
Admission: EM | Admit: 2016-02-09 | Discharge: 2016-02-09 | Disposition: A | Discharge status: Home / Self Care | Payer: PRIVATE HEALTH INSURANCE | Attending: Emergency Medicine | Admitting: Emergency Medicine

## 2016-02-09 DIAGNOSIS — H6091 Unspecified otitis externa, right ear: Secondary | ICD-10-CM | POA: Diagnosis not present

## 2016-02-09 MED ORDER — NEOMYCIN-POLYMYXIN-HC 3.5-10000-1 OT SUSP: 4.0000 [drp] | Freq: Four times a day (QID) | OTIC | 0 refills | Status: DC

## 2016-02-09 NOTE — ED Triage Notes (Signed)
Patient states he used a paperclip to scratch his ear and he believes that has lead to an ear infection. Pain started about 2 days ago.

## 2016-02-09 NOTE — ED Provider Notes (Signed)
HPI  SUBJECTIVE:  Phillip Spencer is a 55 y.o. male who presents with right ear pain, swelling, decreased hearing, increased temperature after trying to clean his right ear with a paperclip 3 days ago. Thinks that he may have scratched it. he has been trying peroxide and bacitracin without improvement. There are no other aggravating or alleviating factors. He denies fevers, otorrhea, vertigo, dizziness, tinnitus, pain with traction on the pinna. He doesn't think that he had been tympanic membrane with a paperclip. He has a past medical history of hypercholesterolemia and GERD. No history of diabetes or hypertension. PMD: Dr. Lilyan Punt   Past Medical History:  Diagnosis Date  . GERD (gastroesophageal reflux disease)   . Hematuria   . Hyperlipidemia   . Pre-diabetes   . Renal calculus, right     Past Surgical History:  Procedure Laterality Date  . CHOLECYSTECTOMY  2011  . CYSTOSCOPY WITH RETROGRADE PYELOGRAM, URETEROSCOPY AND STENT PLACEMENT Right 09/06/2014   Procedure: CYSTOSCOPY WITH RETROGRADE PYELOGRAM, RIGHT DIAGNOSTIC URETEROSCOPY AND STENT PLACEMENT;  Surgeon: Sebastian Ache, MD;  Location: [facility];  Service: Urology;  Laterality: Right;  . CYSTOSCOPY WITH RETROGRADE PYELOGRAM, URETEROSCOPY AND STENT PLACEMENT Right 09/27/2014   Procedure: CYSTOSCOPY WITH RETROGRADE PYELOGRAM, RIGHT URETEROSCOPY AND STENT EXCHANGE, LASER LITHOTRIPSY WITH STONE BASKETRY;  Surgeon: Sebastian Ache, MD;  Location: [facility];  Service: Urology;  Laterality: Right;  . FOOT SURGERY Bilateral   . HERNIA REPAIR     ventral x 2  . HOLMIUM LASER APPLICATION Right 09/27/2014   Procedure: HOLMIUM LASER APPLICATION;  Surgeon: Sebastian Ache, MD;  Location: [facility];  Service: Urology;  Laterality: Right;  . KNEE ARTHROSCOPY Right   . NASAL SINUS SURGERY      History reviewed. No pertinent family history.  Social History  Substance Use Topics  .  Smoking status: Former Smoker    Quit date: 08/31/1984  . Smokeless tobacco: Former Neurosurgeon    Quit date: 02/05/1991     Comment: only smoked for one year. quit in 1985. quit smokeless tobacco in  1992  . Alcohol use 2.4 oz/week    4 Cans of beer per week    No current facility-administered medications for this encounter.   Current Outpatient Prescriptions:  .  aspirin 81 MG tablet, Take 81 mg by mouth daily., Disp: , Rfl:  .  cholestyramine (QUESTRAN) 4 GM/DOSE powder, Take by mouth at bedtime., Disp: , Rfl:  .  Cyanocobalamin (B-12 PO), Take by mouth., Disp: , Rfl:  .  esomeprazole (NEXIUM) 40 MG capsule, Take 40 mg by mouth daily before breakfast., Disp: , Rfl:  .  ibuprofen (ADVIL,MOTRIN) 200 MG tablet, Take 800 mg by mouth every 4 (four) hours as needed for headache, moderate pain or cramping., Disp: , Rfl:  .  Nutritional Supplements (JUICE PLUS FIBRE PO), Take 3 capsules by mouth at bedtime. 1- Veggies, 1- Fruit, 1-Berry, Disp: , Rfl:  .  Omega-3 Fatty Acids (FISH OIL) 1200 MG CAPS, Take by mouth., Disp: , Rfl:  .  pravastatin (PRAVACHOL) 80 MG tablet, Take 1 tablet (80 mg total) by mouth daily., Disp: 90 tablet, Rfl: 1 .  neomycin-polymyxin-hydrocortisone (CORTISPORIN) 3.5-10000-1 otic suspension, Place 4 drops into the right ear 4 (four) times daily. X 7 days, Disp: 7.5 mL, Rfl: 0  Allergies  Allergen Reactions  . Amoxicillin Nausea And Vomiting     ROS  As noted in HPI.   Physical Exam  BP 130/86 (BP  Location: Right Arm)   Pulse 61   Temp 97.9 F (36.6 C) (Oral)   Resp 18   Ht 6' (1.829 m)   Wt 295 lb (133.8 kg)   SpO2 97%   BMI 40.01 kg/m   Constitutional: Well developed, well nourished, no acute distress Eyes:  EOMI, conjunctiva normal bilaterally HENT: Normocephalic, atraumatic,mucus membranes moist. Positive swelling, erythema right external ear canal. Unable to visualize TM due to the extensive swelling. No crusting, drainage. No pain with traction on pinna or  palpation of tragus. Decreased but intact hearing  grossly in the right ear compared to the left ear. Lymph: No cervical lymphadenopathy. Respiratory: Normal inspiratory effort Cardiovascular: Normal rate GI: nondistended skin: No rash, skin intact Musculoskeletal: no deformities Neurologic: Alert & oriented x 3, no focal neuro deficits Psychiatric: Speech and behavior appropriate   ED Course   Medications - No data to display  No orders of the defined types were placed in this encounter.   No results found for this or any previous visit (from the past 24 hour(s)). No results found.  ED Clinical Impression  Otitis externa, right   ED Assessment/Plan  Inserted ear wick. Patient tolerated this well. Home with Cortisporin suspension as patient may have a ruptured tympanic membrane, however was unable to visualize this due to the swelling today. He'll follow-up with his primary care physician as needed. He will follow-up with his ENT if he has persistent hearing loss when the swelling improves.  Discussed  MDM, plan and followup with patient. Discussed sn/sx that should prompt return to the ED. Patient agrees with plan.   *This clinic note was created using Dragon dictation software. Therefore, there may be occasional mistakes despite careful proofreading.  ?   Domenick Gong, MD 02/09/16 [phone]

## 2016-02-09 NOTE — Discharge Instructions (Signed)
Follow-up with your PMD as needed. It is okay if the ear wick falls out after couple days if you feel that the antibiotic is getting all the way down into your ear. You'll need to follow up with your ear nose and throat specialist or Dr. Elenore Rota who is located here in this building, if you have vertigo, dizziness, persistent ringing in your ears, or continued hearing loss. We will need to make sure that you do not have a perforated tympanic membrane.

## 2016-06-11 ENCOUNTER — Other Ambulatory Visit: Payer: Self-pay | Admitting: Family Medicine

## 2016-06-14 LAB — BASIC METABOLIC PANEL
BUN / CREAT RATIO: 12 (ref 9–20)
BUN: 9 mg/dL (ref 6–24)
CO2: 25 mmol/L (ref 18–29)
CREATININE: 0.75 mg/dL — AB (ref 0.76–1.27)
Calcium: 9.3 mg/dL (ref 8.7–10.2)
Chloride: 102 mmol/L (ref 96–106)
GFR, EST AFRICAN AMERICAN: 120 mL/min/{1.73_m2} (ref >59)
GFR, EST NON AFRICAN AMERICAN: 104 mL/min/{1.73_m2} (ref >59)
Glucose: 107 mg/dL — ABNORMAL HIGH (ref 65–99)
Potassium: 4.8 mmol/L (ref 3.5–5.2)
Sodium: 144 mmol/L (ref 134–144)

## 2016-06-14 LAB — LIPID PANEL
Chol/HDL Ratio: 3.2 ratio units (ref 0.0–5.0)
Cholesterol, Total: 226 mg/dL — ABNORMAL HIGH (ref 100–199)
HDL: 70 mg/dL (ref >39)
LDL Calculated: 127 mg/dL — ABNORMAL HIGH (ref 0–99)
Triglycerides: 147 mg/dL (ref 0–149)
VLDL Cholesterol Cal: 29 mg/dL (ref 5–40)

## 2016-06-14 LAB — HEMOGLOBIN A1C
Est. average glucose Bld gHb Est-mCnc: 114 mg/dL
Hgb A1c MFr Bld: 5.6 % (ref 4.8–5.6)

## 2016-06-14 LAB — HEPATIC FUNCTION PANEL
ALT: 65 IU/L — AB (ref 0–44)
AST: 44 IU/L — ABNORMAL HIGH (ref 0–40)
Albumin: 4.3 g/dL (ref 3.5–5.5)
Alkaline Phosphatase: 96 IU/L (ref 39–117)
BILIRUBIN TOTAL: 0.6 mg/dL (ref 0.0–1.2)
BILIRUBIN, DIRECT: 0.16 mg/dL (ref 0.00–0.40)
TOTAL PROTEIN: 6.8 g/dL (ref 6.0–8.5)

## 2016-06-14 LAB — PSA: PROSTATE SPECIFIC AG, SERUM: 0.3 ng/mL (ref 0.0–4.0)

## 2016-06-23 ENCOUNTER — Ambulatory Visit (INDEPENDENT_AMBULATORY_CARE_PROVIDER_SITE_OTHER): Payer: PRIVATE HEALTH INSURANCE | Admitting: Family Medicine

## 2016-06-23 ENCOUNTER — Encounter: Payer: Self-pay | Admitting: Family Medicine

## 2016-06-23 VITALS — BP 132/96 | Ht 71.0 in | Wt 308.6 lb

## 2016-06-23 DIAGNOSIS — Z0000 Encounter for general adult medical examination without abnormal findings: Secondary | ICD-10-CM | POA: Diagnosis not present

## 2016-06-23 DIAGNOSIS — E7849 Other hyperlipidemia: Secondary | ICD-10-CM

## 2016-06-23 DIAGNOSIS — R740 Nonspecific elevation of levels of transaminase and lactic acid dehydrogenase [LDH]: Secondary | ICD-10-CM | POA: Diagnosis not present

## 2016-06-23 DIAGNOSIS — E784 Other hyperlipidemia: Secondary | ICD-10-CM | POA: Diagnosis not present

## 2016-06-23 DIAGNOSIS — I10 Essential (primary) hypertension: Secondary | ICD-10-CM

## 2016-06-23 DIAGNOSIS — G4700 Insomnia, unspecified: Secondary | ICD-10-CM

## 2016-06-23 DIAGNOSIS — R7401 Elevation of levels of liver transaminase levels: Secondary | ICD-10-CM

## 2016-06-23 MED ORDER — LISINOPRIL 5 MG PO TABS: 5.0000 mg | ORAL_TABLET | Freq: Every day | ORAL | 5 refills | Status: DC

## 2016-06-23 MED ORDER — ATORVASTATIN CALCIUM 20 MG PO TABS: 20.0000 mg | ORAL_TABLET | Freq: Every day | ORAL | 5 refills | Status: DC

## 2016-06-23 MED ORDER — ZOLPIDEM TARTRATE 5 MG PO TABS: 5.0000 mg | ORAL_TABLET | Freq: Every evening | ORAL | 2 refills | Status: DC | PRN

## 2016-06-23 NOTE — Progress Notes (Signed)
Subjective:    Patient ID: Phillip Spencer, male    DOB: 1961/04/20, 55 y.o.   MRN: [id number]  HPI The patient comes in today for a wellness visit.  Patient relates that he is under a lot of stress. Has been difficult time handling work load. Not suicidal not depressed. Having a hard time sleeping. Finds himself feeling stressed. Sometimes drinks alcohol in the evening to cope with it. Denies abuse of the alcohol during the day. Sometimes eats what he should eat and has been gaining weight not exercising  A review of their health history was completed.  A review of medications was also completed.  Any needed refills: none  Eating habits: good  Falls/  MVA accidents in past few months:none   Regular exercise: not good  Specialist pt sees on regular basis: Dr. Earlean PolkaSolik  Preventative health issues were discussed.   Additional concerns: not sleeping well, stress levels high  Last colonoscopy- 2013   Review of Systems  Constitutional: Negative for activity change, appetite change and fever.  HENT: Negative for congestion and rhinorrhea.   Eyes: Negative for discharge.  Respiratory: Negative for cough and wheezing.   Cardiovascular: Negative for chest pain.  Gastrointestinal: Negative for abdominal pain, blood in stool and vomiting.  Genitourinary: Negative for difficulty urinating and frequency.  Musculoskeletal: Negative for neck pain.  Skin: Negative for rash.  Allergic/Immunologic: Negative for environmental allergies and food allergies.  Neurological: Negative for weakness and headaches.  Psychiatric/Behavioral: Negative for agitation.       Objective:   Physical Exam  Constitutional: He appears well-developed and well-nourished.  HENT:  Head: Normocephalic and atraumatic.  Right Ear: External ear normal.  Left Ear: External ear normal.  Nose: Nose normal.  Mouth/Throat: Oropharynx is clear and moist.  Eyes: EOM are normal. Pupils are equal, round, and reactive  to light.  Neck: Normal range of motion. Neck supple. No thyromegaly present.  Cardiovascular: Normal rate, regular rhythm and normal heart sounds.   No murmur heard. Pulmonary/Chest: Effort normal and breath sounds normal. No respiratory distress. He has no wheezes.  Abdominal: Soft. Bowel sounds are normal. He exhibits no distension and no mass. There is no tenderness.  Genitourinary: Penis normal.  Musculoskeletal: Normal range of motion. He exhibits no edema.  Lymphadenopathy:    He has no cervical adenopathy.  Neurological: He is alert. He exhibits normal muscle tone.  Skin: Skin is warm and dry. No erythema.  Psychiatric: He has a normal mood and affect. His behavior is normal. Judgment normal.   His lab work was reviewed in detail showed elevated liver enzymes it also showed elevated cholesterol plus his blood pressure is significantly elevated as well this puts him at risk for heart disease plus also his weight is gone up and he is not managing his stress well.  We talked about stress reduction techniques. We talked about using Ambien to help him sleep at night. Currently does not need antidepressant. Patient will avoid alcohol.  We did discuss elevated liver enzymes probably related to alcohol use and fatty liver avoid alcohol repeat lab work again in several weeks' time may need further testing  Patient with significant elevation of cholesterol we reviewed over previous numbers current numbers in the risk for heart disease decision made to go ahead and start medication  Blood pressure also elevated patient not eating as healthy as he should needs to exercise more and lose weight.  Patient denies being suicidal denies being depressed  Assessment & Plan:  Adult wellness-complete.wellness physical was conducted today. Importance of diet and exercise were discussed in detail. In addition to this a discussion regarding safety was also covered. We also reviewed over immunizations  and gave recommendations regarding current immunization needed for age. In addition to this additional areas were also touched on including: Preventative health exams needed: Colonoscopy 2013  Patient was advised yearly wellness exam  Moderate hypertension started him lisinopril 5 mg daily increase walking watch diet try to lose weight reduce stress  Hyperlipidemia a toward a statin 20 mg check lab work in 8-10 weeks May need adjustment follow-up within 3 months  Insomnia stress-related issues Ambien 5 mg daily at bedtime to try to help with sleep give at least 8 hours for sleep when taking this medicine only take medicine when at home  Patient not depressed but under a lot of stress.

## 2016-08-31 ENCOUNTER — Ambulatory Visit
Admission: EM | Admit: 2016-08-31 | Discharge: 2016-08-31 | Disposition: A | Discharge status: Home / Self Care | Payer: PRIVATE HEALTH INSURANCE | Attending: Family Medicine | Admitting: Family Medicine

## 2016-08-31 ENCOUNTER — Encounter: Payer: Self-pay | Admitting: Emergency Medicine

## 2016-08-31 DIAGNOSIS — R69 Illness, unspecified: Secondary | ICD-10-CM

## 2016-08-31 DIAGNOSIS — J111 Influenza due to unidentified influenza virus with other respiratory manifestations: Secondary | ICD-10-CM

## 2016-08-31 LAB — RAPID STREP SCREEN (MED CTR MEBANE ONLY): Streptococcus, Group A Screen (Direct): NEGATIVE

## 2016-08-31 MED ORDER — OSELTAMIVIR PHOSPHATE 75 MG PO CAPS: 75.0000 mg | ORAL_CAPSULE | Freq: Two times a day (BID) | ORAL | 0 refills | Status: DC

## 2016-08-31 MED ORDER — BENZONATATE 100 MG PO CAPS: 100.0000 mg | ORAL_CAPSULE | Freq: Three times a day (TID) | ORAL | 0 refills | Status: DC | PRN

## 2016-08-31 MED ORDER — HYDROCOD POLST-CPM POLST ER 10-8 MG/5ML PO SUER: 5.0000 mL | Freq: Every evening | ORAL | 0 refills | Status: DC | PRN

## 2016-08-31 NOTE — ED Provider Notes (Signed)
[facility] ____________________________________________  Time seen: Approximately 4:39 PM  I have reviewed the triage vital signs and the nursing notes.   HISTORY  Chief Complaint Fever; Generalized Body Aches; and Cough   HPI Phillip Spencer is a 56 y.o. male presents with wife at bedside for the complaints of 2 days of runny nose, nasal congestion, cough, chills, bodyaches and associated fever. Reports subjective fever at home, not measured. Reports has been taking over-the-counter DayQuil and congestion medications intermittently. No medications taken from this morning. Reports some sick contacts at work, denies home sick contacts. Reports continues to drink fluids, decreased appetite. Reports one episode of diarrhea today, no nausea or vomiting. Reports cough is a dry hacking cough, intermittently waking him up at night.  Denies chest pain, shortness of breath, abdominal pain, dysuria, extremity pain, extremity swelling or rash. Denies recent hospitalization. Denies recent immobilization. Denies recent sickness. Denies recent antibiotic use. Denies cardiac history. Denies renal insufficiency.  Lilyan Punt, MD: PCP   Past Medical History:  Diagnosis Date  . GERD (gastroesophageal reflux disease)   . Hematuria   . Hyperlipidemia   . Pre-diabetes   . Renal calculus, right     Patient Active Problem List   Diagnosis Date Noted  . GERD (gastroesophageal reflux disease) 05/16/2015  . Hypertriglyceridemia 11/25/2013  . Morbid obesity (HCC) 11/25/2013  . Hyperlipidemia 02/01/2013  . Prediabetes 02/01/2013    Past Surgical History:  Procedure Laterality Date  . CHOLECYSTECTOMY  2011  . CYSTOSCOPY WITH RETROGRADE PYELOGRAM, URETEROSCOPY AND STENT PLACEMENT Right 09/06/2014   Procedure: CYSTOSCOPY WITH RETROGRADE PYELOGRAM, RIGHT DIAGNOSTIC URETEROSCOPY AND STENT PLACEMENT;  Surgeon: Sebastian Ache, MD;  Location: [facility];  Service: Urology;   Laterality: Right;  . CYSTOSCOPY WITH RETROGRADE PYELOGRAM, URETEROSCOPY AND STENT PLACEMENT Right 09/27/2014   Procedure: CYSTOSCOPY WITH RETROGRADE PYELOGRAM, RIGHT URETEROSCOPY AND STENT EXCHANGE, LASER LITHOTRIPSY WITH STONE BASKETRY;  Surgeon: Sebastian Ache, MD;  Location: [facility];  Service: Urology;  Laterality: Right;  . FOOT SURGERY Bilateral   . HERNIA REPAIR     ventral x 2  . HOLMIUM LASER APPLICATION Right 09/27/2014   Procedure: HOLMIUM LASER APPLICATION;  Surgeon: Sebastian Ache, MD;  Location: [facility];  Service: Urology;  Laterality: Right;  . KNEE ARTHROSCOPY Right   . NASAL SINUS SURGERY       No current facility-administered medications for this encounter.   Current Outpatient Prescriptions:  .  aspirin 81 MG tablet, Take 81 mg by mouth daily., Disp: , Rfl:  .  atorvastatin (LIPITOR) 20 MG tablet, Take 1 tablet (20 mg total) by mouth daily with breakfast., Disp: 30 tablet, Rfl: 5 .  benzonatate (TESSALON PERLES) 100 MG capsule, Take 1 capsule (100 mg total) by mouth 3 (three) times daily as needed for cough., Disp: 15 capsule, Rfl: 0 .  chlorpheniramine-HYDROcodone (TUSSIONEX PENNKINETIC ER) 10-8 MG/5ML SUER, Take 5 mLs by mouth at bedtime as needed for cough. do not drive or operate machinery while taking as can cause drowsiness., Disp: 75 mL, Rfl: 0 .  cholestyramine (QUESTRAN) 4 GM/DOSE powder, Take by mouth at bedtime., Disp: , Rfl:  .  Cyanocobalamin (B-12 PO), Take by mouth., Disp: , Rfl:  .  esomeprazole (NEXIUM) 40 MG capsule, Take 40 mg by mouth daily before breakfast., Disp: , Rfl:  .  ibuprofen (ADVIL,MOTRIN) 200 MG tablet, Take 800 mg by mouth every 4 (four) hours as needed for headache, moderate pain or cramping., Disp: , Rfl:  .  lisinopril (PRINIVIL,ZESTRIL) 5 MG tablet, Take 1 tablet (5 mg total) by mouth daily., Disp: 30 tablet, Rfl: 5 .  Nutritional Supplements (JUICE PLUS FIBRE PO), Take 3 capsules by mouth at bedtime.  1- Veggies, 1- Fruit, 1-Berry, Disp: , Rfl:  .  Omega-3 Fatty Acids (FISH OIL) 1200 MG CAPS, Take by mouth., Disp: , Rfl:  .  oseltamivir (TAMIFLU) 75 MG capsule, Take 1 capsule (75 mg total) by mouth every 12 (twelve) hours., Disp: 10 capsule, Rfl: 0 .  zolpidem (AMBIEN) 5 MG tablet, Take 1 tablet (5 mg total) by mouth at bedtime as needed for sleep., Disp: 30 tablet, Rfl: 2  Allergies Amoxicillin  History reviewed. No pertinent family history.  Social History Social History  Substance Use Topics  . Smoking status: Former Smoker    Quit date: 08/31/1984  . Smokeless tobacco: Former NeurosurgeonUser    Quit date: 02/05/1991     Comment: only smoked for one year. quit in 1985. quit smokeless tobacco in  1992  . Alcohol use 2.4 oz/week    4 Cans of beer per week    Review of Systems Constitutional: As above. Eyes: No visual changes. ENT: Mild sore throat. Cardiovascular: Denies chest pain. Respiratory: Denies shortness of breath. Gastrointestinal: No abdominal pain.  No nausea, no vomiting. No constipation. Genitourinary: Negative for dysuria. Musculoskeletal: Negative for back pain. Skin: Negative for rash. Neurological: Negative for headaches, focal weakness or numbness.  10-point ROS otherwise negative.  ____________________________________________   PHYSICAL EXAM:  VITAL SIGNS: ED Triage Vitals  Enc Vitals Group     BP 08/31/16 1202 103/67     Pulse Rate 08/31/16 1202 95     Resp 08/31/16 1202 16     Temp 08/31/16 1202 100.3 F (37.9 C)     Temp Source 08/31/16 1202 Oral     SpO2 08/31/16 1202 97 %     Weight 08/31/16 1201 300 lb (136.1 kg)     Height 08/31/16 1201 6' (1.829 m)     Head Circumference --      Peak Flow --      Pain Score 08/31/16 1202 5     Pain Loc --      Pain Edu? --      Excl. in GC? --    Constitutional: Alert and oriented. Well appearing and in no acute distress. Eyes: Conjunctivae are normal. PERRL. EOMI. Head: Atraumatic. No sinus tenderness  to palpation. No swelling. No erythema.  Ears: no erythema, normal TMs bilaterally.   Nose:Nasal congestion with clear rhinorrhea  Mouth/Throat: Mucous membranes are moist. Mild pharyngeal erythema. No tonsillar swelling or exudate.  Neck: No stridor.  No cervical spine tenderness to palpation. Hematological/Lymphatic/Immunilogical: No cervical lymphadenopathy. Cardiovascular: Normal rate, regular rhythm. Grossly normal heart sounds.  Good peripheral circulation. Respiratory: Normal respiratory effort.  No retractions. No wheezes, rales or rhonchi. Good air movement.  Gastrointestinal: Soft and nontender. No CVA tenderness. Musculoskeletal: Ambulatory with steady gait. No cervical, thoracic or lumbar tenderness to palpation.Bilateral lower extremities nontender and no edema noted. Neurologic:  Normal speech and language. No gait instability. Skin:  Skin appears warm, dry and intact. No rash noted. Psychiatric: Mood and affect are normal. Speech and behavior are normal.  ___________________________________________   LABS (all labs ordered are listed, but only abnormal results are displayed)  Labs Reviewed  RAPID STREP SCREEN (NOT [facility])  CULTURE, GROUP A STREP [facility])     PROCEDURES Procedures    INITIAL IMPRESSION / ASSESSMENT AND PLAN /  ED COURSE  Pertinent labs & imaging results that were available during my care of the patient were reviewed by me and considered in my medical decision making (see chart for details).  Well-appearing patient. No acute distress. Wife at bedside. Quick strep negative, will culture..Discussed with patient and spouse, suspect influenza. Discussed evaluation treatment option. Will treat patient with oral Tamiflu, when necessary Tessalon Perles and when necessary Tussionex. Discussed rest, fluids and supportive care.Discussed indication, risks and benefits of medications with patient.  Discussed follow up with Primary care physician this week.  Discussed follow up and return parameters including no resolution or any worsening concerns. Patient verbalized understanding and agreed to plan.   ____________________________________________   FINAL CLINICAL IMPRESSION(S) / ED DIAGNOSES  Final diagnoses:  Influenza-like illness     Discharge Medication List as of 08/31/2016  1:05 PM    START taking these medications   Details  benzonatate (TESSALON PERLES) 100 MG capsule Take 1 capsule (100 mg total) by mouth 3 (three) times daily as needed for cough., Starting Sun 08/31/2016, Normal    chlorpheniramine-HYDROcodone (TUSSIONEX PENNKINETIC ER) 10-8 MG/5ML SUER Take 5 mLs by mouth at bedtime as needed for cough. do not drive or operate machinery while taking as can cause drowsiness., Starting Sun 08/31/2016, Print    oseltamivir (TAMIFLU) 75 MG capsule Take 1 capsule (75 mg total) by mouth every 12 (twelve) hours., Starting Sun 08/31/2016, Normal        Note: This dictation was prepared with Dragon dictation along with smaller phrase technology. Any transcriptional errors that result from this process are unintentional.         Renford Dills, NP 08/31/16 1645

## 2016-08-31 NOTE — ED Triage Notes (Signed)
Patient c/o cough, bodyaches, HAs, and fever that started on Friday.

## 2016-08-31 NOTE — Discharge Instructions (Signed)
Take medication as prescribed. Rest. Drink plenty of fluids.  ° °Follow up with your primary care physician this week as needed. Return to Urgent care for new or worsening concerns.  ° °

## 2016-09-03 LAB — CULTURE, GROUP A STREP (THRC)

## 2016-09-04 ENCOUNTER — Ambulatory Visit: Payer: PRIVATE HEALTH INSURANCE | Admitting: Family Medicine

## 2016-09-19 LAB — HEPATIC FUNCTION PANEL
ALBUMIN: 4.2 g/dL (ref 3.5–5.5)
ALT: 44 IU/L (ref 0–44)
AST: 23 IU/L (ref 0–40)
Alkaline Phosphatase: 91 IU/L (ref 39–117)
BILIRUBIN TOTAL: 0.7 mg/dL (ref 0.0–1.2)
Bilirubin, Direct: 0.16 mg/dL (ref 0.00–0.40)
Total Protein: 6.7 g/dL (ref 6.0–8.5)

## 2016-09-19 LAB — BASIC METABOLIC PANEL
BUN/Creatinine Ratio: 14 (ref 9–20)
BUN: 11 mg/dL (ref 6–24)
CALCIUM: 9.4 mg/dL (ref 8.7–10.2)
CO2: 21 mmol/L (ref 18–29)
Chloride: 105 mmol/L (ref 96–106)
Creatinine, Ser: 0.76 mg/dL (ref 0.76–1.27)
GFR, EST AFRICAN AMERICAN: 119 mL/min/{1.73_m2} (ref >59)
GFR, EST NON AFRICAN AMERICAN: 103 mL/min/{1.73_m2} (ref >59)
Glucose: 102 mg/dL — ABNORMAL HIGH (ref 65–99)
POTASSIUM: 4.5 mmol/L (ref 3.5–5.2)
Sodium: 142 mmol/L (ref 134–144)

## 2016-09-19 LAB — LIPID PANEL
CHOL/HDL RATIO: 3 ratio (ref 0.0–5.0)
Cholesterol, Total: 181 mg/dL (ref 100–199)
HDL: 60 mg/dL (ref >39)
LDL Calculated: 98 mg/dL (ref 0–99)
TRIGLYCERIDES: 117 mg/dL (ref 0–149)
VLDL CHOLESTEROL CAL: 23 mg/dL (ref 5–40)

## 2016-09-19 LAB — HEPATITIS C ANTIBODY

## 2016-09-19 LAB — HEPATITIS B SURFACE ANTIGEN: Hepatitis B Surface Ag: NEGATIVE

## 2016-09-24 ENCOUNTER — Ambulatory Visit (INDEPENDENT_AMBULATORY_CARE_PROVIDER_SITE_OTHER): Payer: PRIVATE HEALTH INSURANCE | Admitting: Family Medicine

## 2016-09-24 ENCOUNTER — Encounter: Payer: Self-pay | Admitting: Family Medicine

## 2016-09-24 VITALS — BP 132/88 | Ht 71.0 in | Wt 307.1 lb

## 2016-09-24 DIAGNOSIS — E784 Other hyperlipidemia: Secondary | ICD-10-CM | POA: Diagnosis not present

## 2016-09-24 DIAGNOSIS — E7849 Other hyperlipidemia: Secondary | ICD-10-CM

## 2016-09-24 DIAGNOSIS — R7303 Prediabetes: Secondary | ICD-10-CM | POA: Diagnosis not present

## 2016-09-24 DIAGNOSIS — K219 Gastro-esophageal reflux disease without esophagitis: Secondary | ICD-10-CM

## 2016-09-24 MED ORDER — ATORVASTATIN CALCIUM 20 MG PO TABS: 20.0000 mg | ORAL_TABLET | Freq: Every day | ORAL | 5 refills | Status: DC

## 2016-09-24 MED ORDER — ZOLPIDEM TARTRATE 5 MG PO TABS: 5.0000 mg | ORAL_TABLET | Freq: Every evening | ORAL | 5 refills | Status: DC | PRN

## 2016-09-24 MED ORDER — LISINOPRIL 5 MG PO TABS: 5.0000 mg | ORAL_TABLET | Freq: Every day | ORAL | 5 refills | Status: DC

## 2016-09-24 NOTE — Progress Notes (Signed)
   Subjective:    Patient ID: Phillip Spencer, male    DOB: June 08, 1961, 56 y.o.   MRN: [id number]  Hyperlipidemia  This is a chronic problem. The current episode started more than 1 year ago.  Lab work looked good hepatitis B hepatitis C was negative lipid profile overall look good kidney functions look good sugar is borderline. Previous A1c looked good. Patient states no other concerns this visit.  Review of Systems Patient denies chest tightness pressure pain shortness breath wheezing denies vomiting diarrhea joint pains    Objective:   Physical Exam Neck no masses lungs clear no crackles heart regular abdomen morbidly obese extremities no edema skin warm dry  Vitals:   09/24/16 0831  BP: 132/88   Raw believe the patient start to do better he will to his best watching diet exercise and trying to lose weight he is doing well with stress management blood pressure is looking good cholesterol lab work looked good insomnia uses Ambien intermittently     Assessment & Plan:  Cardiac prevention continue 81 mg aspirin HTN good control continue current measures Insomnia use Ambien at nighttime to help with sleep Reflux issue continue PPI Hyperlipidemia-decent control continue cholesterol medicine watching diet Morbid obesity encouraged patient to lose 25-50 pounds over the next 6 months Liver enzymes resolved patient had stopped drinking

## 2016-12-09 ENCOUNTER — Other Ambulatory Visit: Payer: Self-pay | Admitting: Family Medicine

## 2017-03-27 ENCOUNTER — Ambulatory Visit: Payer: PRIVATE HEALTH INSURANCE | Admitting: Family Medicine

## 2017-05-08 ENCOUNTER — Ambulatory Visit
Admission: EM | Admit: 2017-05-08 | Discharge: 2017-05-08 | Disposition: A | Discharge status: Home / Self Care | Payer: PRIVATE HEALTH INSURANCE | Attending: Family Medicine | Admitting: Family Medicine

## 2017-05-08 ENCOUNTER — Encounter: Payer: Self-pay | Admitting: Emergency Medicine

## 2017-05-08 DIAGNOSIS — S39012A Strain of muscle, fascia and tendon of lower back, initial encounter: Secondary | ICD-10-CM

## 2017-05-08 MED ORDER — HYDROCODONE-ACETAMINOPHEN 5-325 MG PO TABS: ORAL_TABLET | ORAL | 0 refills | Status: DC

## 2017-05-08 MED ORDER — CYCLOBENZAPRINE HCL 10 MG PO TABS: 10.0000 mg | ORAL_TABLET | Freq: Three times a day (TID) | ORAL | 0 refills | Status: DC | PRN

## 2017-05-08 NOTE — ED Provider Notes (Signed)
[facility]    CSN: [id number] Arrival date & time: 05/08/17  0919     History   Chief Complaint Chief Complaint  Patient presents with  . Back Pain    HPI Phillip Spencer is a 56 y.o. male.   The history is provided by the patient.  Back Pain  Location:  Lumbar spine Quality:  Aching Radiates to:  Does not radiate Pain severity:  Severe Pain is:  Same all the time Onset quality:  Sudden Duration:  2 days Timing:  Constant Progression:  Unchanged Chronicity:  New Context: twisting (and bending)   Relieved by:  Ibuprofen and lying down   Past Medical History:  Diagnosis Date  . GERD (gastroesophageal reflux disease)   . Hematuria   . Hyperlipidemia   . Pre-diabetes   . Renal calculus, right     Patient Active Problem List   Diagnosis Date Noted  . GERD (gastroesophageal reflux disease) 05/16/2015  . Hypertriglyceridemia 11/25/2013  . Morbid obesity (HCC) 11/25/2013  . Hyperlipidemia 02/01/2013  . Prediabetes 02/01/2013    Past Surgical History:  Procedure Laterality Date  . CHOLECYSTECTOMY  2011  . CYSTOSCOPY WITH RETROGRADE PYELOGRAM, URETEROSCOPY AND STENT PLACEMENT Right 09/06/2014   Procedure: CYSTOSCOPY WITH RETROGRADE PYELOGRAM, RIGHT DIAGNOSTIC URETEROSCOPY AND STENT PLACEMENT;  Surgeon: Sebastian Ache, MD;  Location: [facility];  Service: Urology;  Laterality: Right;  . CYSTOSCOPY WITH RETROGRADE PYELOGRAM, URETEROSCOPY AND STENT PLACEMENT Right 09/27/2014   Procedure: CYSTOSCOPY WITH RETROGRADE PYELOGRAM, RIGHT URETEROSCOPY AND STENT EXCHANGE, LASER LITHOTRIPSY WITH STONE BASKETRY;  Surgeon: Sebastian Ache, MD;  Location: [facility];  Service: Urology;  Laterality: Right;  . FOOT SURGERY Bilateral   . HERNIA REPAIR     ventral x 2  . HOLMIUM LASER APPLICATION Right 09/27/2014   Procedure: HOLMIUM LASER APPLICATION;  Surgeon: Sebastian Ache, MD;  Location: [facility];  Service:  Urology;  Laterality: Right;  . KNEE ARTHROSCOPY Right   . NASAL SINUS SURGERY         Home Medications    Prior to Admission medications   Medication Sig Start Date End Date Taking? Authorizing Provider  aspirin 81 MG tablet Take 81 mg by mouth daily.    [provider]  atorvastatin (LIPITOR) 20 MG tablet Take 1 tablet (20 mg total) by mouth daily with breakfast. 09/24/16   Babs Sciara, MD  cholestyramine Lanetta Inch) 4 GM/DOSE powder Take by mouth at bedtime.    [provider]  Cyanocobalamin (B-12 PO) Take by mouth.    [provider]  cyclobenzaprine (FLEXERIL) 10 MG tablet Take 1 tablet (10 mg total) by mouth 3 (three) times daily as needed for muscle spasms. 05/08/17   Payton Mccallum, MD  esomeprazole (NEXIUM) 40 MG capsule Take 40 mg by mouth daily before breakfast.    [provider]  HYDROcodone-acetaminophen (NORCO/VICODIN) 5-325 MG tablet 1-2 tabs po q 8 hours prn 05/08/17   Payton Mccallum, MD  ibuprofen (ADVIL,MOTRIN) 200 MG tablet Take 800 mg by mouth every 4 (four) hours as needed for headache, moderate pain or cramping.    [provider]  lisinopril (PRINIVIL,ZESTRIL) 5 MG tablet Take 1 tablet (5 mg total) by mouth daily. 09/24/16   Babs Sciara, MD  Nutritional Supplements (JUICE PLUS FIBRE PO) Take 3 capsules by mouth at bedtime. 1- Veggies, 1- Fruit, 1-Berry    [provider]  Omega-3 Fatty Acids (FISH OIL) 1200 MG CAPS Take by mouth.  [provider]  zolpidem (AMBIEN) 5 MG tablet Take 1 tablet (5 mg total) by mouth at bedtime as needed for sleep. 09/24/16   Babs SciaraLuking, Scott A, MD    Family History History reviewed. No pertinent family history.  Social History Social History  Substance Use Topics  . Smoking status: Former Smoker    Quit date: 08/31/1984  . Smokeless tobacco: Former NeurosurgeonUser    Quit date: 02/05/1991     Comment: only smoked for one year. quit in 1985. quit smokeless tobacco in  1992    . Alcohol use 2.4 oz/week    4 Cans of beer per week     Allergies   Amoxicillin   Review of Systems Review of Systems  Musculoskeletal: Positive for back pain.     Physical Exam Triage Vital Signs ED Triage Vitals  Enc Vitals Group     BP 05/08/17 0946 (!) 142/94     Pulse Rate 05/08/17 0946 70     Resp 05/08/17 0946 16     Temp 05/08/17 0946 98.2 F (36.8 C)     Temp Source 05/08/17 0946 Oral     SpO2 05/08/17 0946 97 %     Weight 05/08/17 0943 (!) 324 lb 15.3 oz (147.4 kg)     Height 05/08/17 0943 6' (1.829 m)     Head Circumference --      Peak Flow --      Pain Score 05/08/17 0943 7     Pain Loc --      Pain Edu? --      Excl. in GC? --    No data found.   Updated Vital Signs BP (!) 142/94 (BP Location: Left Arm)   Pulse 70   Temp 98.2 F (36.8 C) (Oral)   Resp 16   Ht 6' (1.829 m)   Wt (!) 324 lb 15.3 oz (147.4 kg)   SpO2 97%   BMI 44.07 kg/m   Visual Acuity Right Eye Distance:   Left Eye Distance:   Bilateral Distance:    Right Eye Near:   Left Eye Near:    Bilateral Near:     Physical Exam  Constitutional: He appears well-developed and well-nourished. No distress.  Neck: Normal range of motion. Neck supple. No tracheal deviation present.  Pulmonary/Chest: Effort normal. No stridor. No respiratory distress.  Musculoskeletal:       Lumbar back: He exhibits tenderness (over the lumbar paraspinous muscles) and spasm. He exhibits normal range of motion, no bony tenderness, no swelling, no edema, no deformity, no laceration, no pain and normal pulse.  Neurological: He is alert. He has normal reflexes. He displays normal reflexes. He exhibits normal muscle tone. Coordination normal.  Skin: No rash noted. He is not diaphoretic.  Nursing note and vitals reviewed.    UC Treatments / Results  Labs (all labs ordered are listed, but only abnormal results are displayed) Labs Reviewed - No data to display  EKG  EKG Interpretation None        Radiology No results found.  Procedures Procedures (including critical care time)  Medications Ordered in UC Medications - No data to display   Initial Impression / Assessment and Plan / UC Course  I have reviewed the triage vital signs and the nursing notes.  Pertinent labs & imaging results that were available during my care of the patient were reviewed by me and considered in my medical decision making (see chart for details).  Final Clinical Impressions(s) / UC Diagnoses   Final diagnoses:  Strain of lumbar region, initial encounter    New Prescriptions Discharge Medication List as of 05/08/2017  9:58 AM    START taking these medications   Details  cyclobenzaprine (FLEXERIL) 10 MG tablet Take 1 tablet (10 mg total) by mouth 3 (three) times daily as needed for muscle spasms., Starting Fri 05/08/2017, Normal    HYDROcodone-acetaminophen (NORCO/VICODIN) 5-325 MG tablet 1-2 tabs po q 8 hours prn, Print       1. diagnosis reviewed with patient 2. rx as per orders above; reviewed possible side effects, interactions, risks and benefits  3. Recommend supportive treatment with heat to area, stretch 4. Follow-up prn if symptoms worsen or don't improve  Controlled Substance Prescriptions [place] Controlled Substance Registry consulted? Not Applicable   Payton Mccallum, MD 05/08/17 1027

## 2017-05-08 NOTE — ED Triage Notes (Signed)
Patient c/o lower back pain and spasms for the past 2 days.  Patient denies injury or fall.

## 2017-05-25 ENCOUNTER — Other Ambulatory Visit: Payer: Self-pay | Admitting: Family Medicine

## 2017-06-03 ENCOUNTER — Telehealth: Payer: Self-pay | Admitting: Family Medicine

## 2017-06-03 DIAGNOSIS — R5383 Other fatigue: Secondary | ICD-10-CM

## 2017-06-03 DIAGNOSIS — Z13220 Encounter for screening for lipoid disorders: Secondary | ICD-10-CM

## 2017-06-03 DIAGNOSIS — Z79899 Other long term (current) drug therapy: Secondary | ICD-10-CM

## 2017-06-03 DIAGNOSIS — R7303 Prediabetes: Secondary | ICD-10-CM

## 2017-06-03 DIAGNOSIS — Z125 Encounter for screening for malignant neoplasm of prostate: Secondary | ICD-10-CM

## 2017-06-03 NOTE — Telephone Encounter (Signed)
Lipid, liver, metabolic 7, A1c, CBC, PSA-also do urine ACR if have not had this within the past 10 months

## 2017-06-03 NOTE — Telephone Encounter (Signed)
Pt last had labs drawn 09/18/2016 and had Hep B,Hep C,Lipid,Liver,Bmet. Please advise. Thanks,CS

## 2017-06-03 NOTE — Telephone Encounter (Signed)
Pt.notified

## 2017-06-03 NOTE — Telephone Encounter (Signed)
I called and left vm to r/c. I have sent in the lab orders to L/[facility].

## 2017-06-03 NOTE — Telephone Encounter (Signed)
Patient requesting labs for upcoming physical on 12/28.

## 2017-06-26 ENCOUNTER — Encounter: Payer: PRIVATE HEALTH INSURANCE | Admitting: Family Medicine

## 2017-07-04 LAB — CBC WITH DIFFERENTIAL/PLATELET
BASOS: 0 %
Basophils Absolute: 0 10*3/uL (ref 0.0–0.2)
EOS (ABSOLUTE): 0.2 10*3/uL (ref 0.0–0.4)
Eos: 2 %
Hematocrit: 46.2 % (ref 37.5–51.0)
Hemoglobin: 15.3 g/dL (ref 13.0–17.7)
IMMATURE GRANS (ABS): 0.1 10*3/uL (ref 0.0–0.1)
Immature Granulocytes: 1 %
LYMPHS ABS: 3 10*3/uL (ref 0.7–3.1)
LYMPHS: 31 %
MCH: 31.9 pg (ref 26.6–33.0)
MCHC: 33.1 g/dL (ref 31.5–35.7)
MCV: 96 fL (ref 79–97)
Monocytes Absolute: 0.7 10*3/uL (ref 0.1–0.9)
Monocytes: 7 %
NEUTROS ABS: 5.8 10*3/uL (ref 1.4–7.0)
Neutrophils: 59 %
PLATELETS: 222 10*3/uL (ref 150–379)
RBC: 4.8 x10E6/uL (ref 4.14–5.80)
RDW: 13.4 % (ref 12.3–15.4)
WBC: 9.8 10*3/uL (ref 3.4–10.8)

## 2017-07-04 LAB — BASIC METABOLIC PANEL
BUN / CREAT RATIO: 12 (ref 9–20)
BUN: 10 mg/dL (ref 6–24)
CHLORIDE: 102 mmol/L (ref 96–106)
CO2: 22 mmol/L (ref 20–29)
Calcium: 9.3 mg/dL (ref 8.7–10.2)
Creatinine, Ser: 0.81 mg/dL (ref 0.76–1.27)
GFR calc Af Amer: 116 mL/min/{1.73_m2} (ref >59)
GFR calc non Af Amer: 100 mL/min/{1.73_m2} (ref >59)
GLUCOSE: 101 mg/dL — AB (ref 65–99)
POTASSIUM: 4.4 mmol/L (ref 3.5–5.2)
SODIUM: 141 mmol/L (ref 134–144)

## 2017-07-04 LAB — HEPATIC FUNCTION PANEL
ALK PHOS: 122 IU/L — AB (ref 39–117)
ALT: 35 IU/L (ref 0–44)
AST: 22 IU/L (ref 0–40)
Albumin: 4.3 g/dL (ref 3.5–5.5)
BILIRUBIN, DIRECT: 0.2 mg/dL (ref 0.00–0.40)
Bilirubin Total: 0.7 mg/dL (ref 0.0–1.2)
Total Protein: 7 g/dL (ref 6.0–8.5)

## 2017-07-04 LAB — MICROALBUMIN / CREATININE URINE RATIO
Creatinine, Urine: 258.1 mg/dL
MICROALB/CREAT RATIO: 8.6 mg/g{creat} (ref 0.0–30.0)
Microalbumin, Urine: 22.1 ug/mL

## 2017-07-04 LAB — LIPID PANEL
Chol/HDL Ratio: 3.1 ratio (ref 0.0–5.0)
Cholesterol, Total: 212 mg/dL — ABNORMAL HIGH (ref 100–199)
HDL: 68 mg/dL (ref >39)
LDL Calculated: 95 mg/dL (ref 0–99)
TRIGLYCERIDES: 246 mg/dL — AB (ref 0–149)
VLDL CHOLESTEROL CAL: 49 mg/dL — AB (ref 5–40)

## 2017-07-04 LAB — HEMOGLOBIN A1C
Est. average glucose Bld gHb Est-mCnc: 131 mg/dL
HEMOGLOBIN A1C: 6.2 % — AB (ref 4.8–5.6)

## 2017-07-04 LAB — PSA: Prostate Specific Ag, Serum: 0.2 ng/mL (ref 0.0–4.0)

## 2017-07-09 ENCOUNTER — Other Ambulatory Visit: Payer: Self-pay | Admitting: Family Medicine

## 2017-07-10 ENCOUNTER — Encounter: Payer: Self-pay | Admitting: Family Medicine

## 2017-07-10 ENCOUNTER — Ambulatory Visit (INDEPENDENT_AMBULATORY_CARE_PROVIDER_SITE_OTHER): Payer: PRIVATE HEALTH INSURANCE | Admitting: Family Medicine

## 2017-07-10 VITALS — BP 132/86 | Ht 71.0 in | Wt 331.4 lb

## 2017-07-10 DIAGNOSIS — Z0001 Encounter for general adult medical examination with abnormal findings: Secondary | ICD-10-CM

## 2017-07-10 DIAGNOSIS — E7849 Other hyperlipidemia: Secondary | ICD-10-CM | POA: Diagnosis not present

## 2017-07-10 DIAGNOSIS — K219 Gastro-esophageal reflux disease without esophagitis: Secondary | ICD-10-CM | POA: Diagnosis not present

## 2017-07-10 DIAGNOSIS — R7303 Prediabetes: Secondary | ICD-10-CM | POA: Diagnosis not present

## 2017-07-10 DIAGNOSIS — Z0000 Encounter for general adult medical examination without abnormal findings: Secondary | ICD-10-CM

## 2017-07-10 NOTE — Progress Notes (Signed)
Subjective:    Patient ID: Phillip Spencer, Phillip Spencer    DOB: 1960/10/15, 56 y.o.   MRN: [id number]  HPI  The patient comes in today for a wellness visit.    A review of their health history was completed.  A review of medications was also completed.  Any needed refills; no  Eating habits: trying to eat better  Falls/  MVA accidents in past few months: none  Regular exercise: daily walks and yard work  Barrister's clerkpecialist pt sees on regular basis: Gi doctor  Preventative health issues were discussed.   Additional concerns: Having trouble focusing We talked at length about the importance of losing weight talked at length about the importance of portion control exercise his weights gone up cause he is working a lot not doing enough to focus on healthy choices in addition to this we also discussed cholesterol we discussed taking his medicine on a regular basis He also takes his blood pressure medicine on a regular basis and tries to minimize salt he has a history of lipids and hypertension.  We also talked about his focus he is going to do a questionnaire and send back to us he may need to be on medication he has had problems with focus all the way since grade school Review of Systems  Constitutional: Negative for activity change, appetite change and fever.  HENT: Negative for congestion and rhinorrhea.   Eyes: Negative for discharge.  Respiratory: Negative for cough and wheezing.   Cardiovascular: Negative for chest pain.  Gastrointestinal: Negative for abdominal pain, blood in stool and vomiting.  Genitourinary: Negative for difficulty urinating and frequency.  Musculoskeletal: Negative for neck pain.  Skin: Negative for rash.  Allergic/Immunologic: Negative for environmental allergies and food allergies.  Neurological: Negative for weakness and headaches.  Psychiatric/Behavioral: Negative for agitation.       Objective:   Physical Exam  Constitutional: He appears  well-developed and well-nourished.  HENT:  Head: Normocephalic and atraumatic.  Right Ear: External ear normal.  Left Ear: External ear normal.  Nose: Nose normal.  Mouth/Throat: Oropharynx is clear and moist.  Eyes: EOM are normal. Pupils are equal, round, and reactive to light.  Neck: Normal range of motion. Neck supple. No thyromegaly present.  Cardiovascular: Normal rate, regular rhythm and normal heart sounds.  No murmur heard. Pulmonary/Chest: Effort normal and breath sounds normal. No respiratory distress. He has no wheezes.  Abdominal: Soft. Bowel sounds are normal. He exhibits no distension and no mass. There is no tenderness.  Genitourinary: Penis normal.  Musculoskeletal: Normal range of motion. He exhibits no edema.  Lymphadenopathy:    He has no cervical adenopathy.  Neurological: He is alert. He exhibits normal muscle tone.  Skin: Skin is warm and dry. No erythema.  Psychiatric: He has a normal mood and affect. His behavior is normal. Judgment normal.          Assessment & Plan:  Adult wellness-complete.wellness physical was conducted today. Importance of diet and exercise were discussed in detail. In addition to this a discussion regarding safety was also covered. We also reviewed over immunizations and gave recommendations regarding current immunization needed for age. In addition to this additional areas were also touched on including: Preventative health exams needed: Colonoscopy up-to-date on colonoscopy  Patient was advised yearly wellness exam  HTN- Patient was seen today as part of a visit regarding hypertension. The importance of healthy diet and regular physical activity was discussed. The importance of compliance with medications discussed.  Ideal goal is to keep blood pressure low elevated levels certainly below 140/90 when possible. The patient was counseled that keeping blood pressure under control lessen his risk of heart attack, stroke, kidney failure,  and early death. The importance of regular follow-ups was discussed with the patient. Low-salt diet such as DASH recommended. Regular physical activity was recommended as well. Patient was advised to keep regular follow-ups.  The patient was seen today as part of an evaluation regarding hyperlipidemia. Recent lab work has been reviewed with the patient as well as the goals for good cholesterol care. In addition to this medications have been discussed the importance of compliance with diet and medications discussed as well. Patient has been informed of potential side effects of medications in the importance to notify us should any problems occur. Finally the patient is aware that poor control of cholesterol, noncompliance can dramatically increase her risk of heart attack strokes and premature death. The patient will keep regular office visits and the patient does agreed to periodic lab work.  Patient needs to do much better job at finding work life balance increasing more hobbies time and free time he is working toward that he needs to work hard at losing weight exercising watching diet follow-up again in several months with lab work at that time  Patient also has symptomatology of adult ADD he will fill out questionnaire for that back to us we may need to bring him back for further discussion

## 2017-07-19 ENCOUNTER — Telehealth: Payer: Self-pay | Admitting: Family Medicine

## 2017-07-19 ENCOUNTER — Encounter: Payer: Self-pay | Admitting: Family Medicine

## 2017-07-19 NOTE — Telephone Encounter (Signed)
A letter was dictated to the patient regarding ADD choices and encouragement for him to schedule an appointment.  Please send a letter to the patient.  The questionnaire will be scanned into the system thank you

## 2017-07-25 ENCOUNTER — Encounter: Payer: Self-pay | Admitting: Gynecology

## 2017-07-25 ENCOUNTER — Ambulatory Visit
Admission: EM | Admit: 2017-07-25 | Discharge: 2017-07-25 | Disposition: A | Discharge status: Home / Self Care | Payer: PRIVATE HEALTH INSURANCE | Attending: Family Medicine | Admitting: Family Medicine

## 2017-07-25 ENCOUNTER — Other Ambulatory Visit: Payer: Self-pay

## 2017-07-25 DIAGNOSIS — J029 Acute pharyngitis, unspecified: Secondary | ICD-10-CM

## 2017-07-25 DIAGNOSIS — B9789 Other viral agents as the cause of diseases classified elsewhere: Secondary | ICD-10-CM

## 2017-07-25 LAB — RAPID STREP SCREEN (MED CTR MEBANE ONLY): Streptococcus, Group A Screen (Direct): NEGATIVE

## 2017-07-25 MED ORDER — PSEUDOEPH-BROMPHEN-DM 30-2-10 MG/5ML PO SYRP: 5.0000 mL | ORAL_SOLUTION | Freq: Four times a day (QID) | ORAL | 0 refills | Status: DC | PRN

## 2017-07-25 MED ORDER — PREDNISONE 10 MG PO TABS: 10.0000 mg | ORAL_TABLET | Freq: Every day | ORAL | 0 refills | Status: DC

## 2017-07-25 NOTE — ED Triage Notes (Signed)
Patient c/o sore throat x 3 days.  

## 2017-07-25 NOTE — Discharge Instructions (Signed)
Rapid strep test was negative today indicating no bacterial infection.  Sore throat is likely due to viral irritation.  Please make sure you are drinking lots of fluids.  Take medications as prescribed.  After finishing prednisone please start ibuprofen 600 mg every 6-8 hours as needed.  You may also take Tylenol 1000 mg every 6 hours for additional pain relief.  Return to the urgent care for any fevers, worsening symptoms or urgent changes in your health.

## 2017-07-25 NOTE — ED Provider Notes (Signed)
[facility]    CSN: [id number] Arrival date & time: 07/25/17  [id number]     History   Chief Complaint Chief Complaint  Patient presents with  . Sore Throat    HPI Phillip Spencer is a 57 y.o. male presents to the urgent care facility for evaluation of sore throat.  Sore throat has been present for 2 days.  He denies any fevers, abdominal pain, nausea vomiting or diarrhea.  No cough or runny nose.  Patient has had some sinus drainage with sinus headache that is 1 out of 10 along the frontal region.  Sore throat pain is 5 out of 10.  Has a hard time swallowing some of his pills but states he is swallowing fluids just fine.  No known contacts with strep.  HPI  Past Medical History:  Diagnosis Date  . GERD (gastroesophageal reflux disease)   . Hematuria   . Hyperlipidemia   . Pre-diabetes   . Renal calculus, right     Patient Active Problem List   Diagnosis Date Noted  . GERD (gastroesophageal reflux disease) 05/16/2015  . Hypertriglyceridemia 11/25/2013  . Morbid obesity (HCC) 11/25/2013  . Hyperlipidemia 02/01/2013  . Prediabetes 02/01/2013    Past Surgical History:  Procedure Laterality Date  . CHOLECYSTECTOMY  2011  . CYSTOSCOPY WITH RETROGRADE PYELOGRAM, URETEROSCOPY AND STENT PLACEMENT Right 09/06/2014   Procedure: CYSTOSCOPY WITH RETROGRADE PYELOGRAM, RIGHT DIAGNOSTIC URETEROSCOPY AND STENT PLACEMENT;  Surgeon: Sebastian Acheheodore Manny, MD;  [facility];  Service: Urology;  Laterality: Right;  . CYSTOSCOPY WITH RETROGRADE PYELOGRAM, URETEROSCOPY AND STENT PLACEMENT Right 09/27/2014   Procedure: CYSTOSCOPY WITH RETROGRADE PYELOGRAM, RIGHT URETEROSCOPY AND STENT EXCHANGE, LASER LITHOTRIPSY WITH STONE BASKETRY;  Surgeon: Sebastian Acheheodore Manny, MD;  [facility] [place];  Service: Urology;  Laterality: Right;  . FOOT SURGERY Bilateral   . HERNIA REPAIR     ventral x 2  . HOLMIUM LASER APPLICATION Right 09/27/2014   Procedure: HOLMIUM  LASER APPLICATION;  Surgeon: Sebastian Acheheodore Manny, MD;  [facility] [place];  Service: Urology;  Laterality: Right;  . KNEE ARTHROSCOPY Right   . NASAL SINUS SURGERY         Home Medications    Prior to Admission medications   Medication Sig Start Date End Date Taking? Authorizing Provider  aspirin 81 MG tablet Take 81 mg by mouth daily.   Yes [provider]  atorvastatin (LIPITOR) 20 MG tablet Take 1 tablet (20 mg total) by mouth daily with breakfast. 09/24/16  Yes Luking, Jonna CoupScott A, MD  cholestyramine Lanetta Inch(QUESTRAN) 4 GM/DOSE powder Take by mouth at bedtime.   Yes [provider]  Cyanocobalamin (B-12 PO) Take by mouth.   Yes [provider]  cyclobenzaprine (FLEXERIL) 10 MG tablet Take 1 tablet (10 mg total) by mouth 3 (three) times daily as needed for muscle spasms. 05/08/17  Yes Payton Mccallumonty, [place], MD  esomeprazole (NEXIUM) 40 MG capsule Take 40 mg by mouth daily before breakfast.   Yes [provider]  ibuprofen (ADVIL,MOTRIN) 200 MG tablet Take 800 mg by mouth every 4 (four) hours as needed for headache, moderate pain or cramping.   Yes [provider]  lisinopril (PRINIVIL,ZESTRIL) 5 MG tablet Take 1 tablet (5 mg total) by mouth daily. 09/24/16  Yes Babs SciaraLuking, Scott A, MD  Omega-3 Fatty Acids (FISH OIL) 1200 MG CAPS Take by mouth.   Yes [provider]  atorvastatin (LIPITOR) 20 MG tablet TAKE (1) TABLET BY MOUTH EVERY DAY WITH  BREAKFAST Patient not taking: Reported on 07/10/2017 07/09/17   Babs Sciara, MD  brompheniramine-pseudoephedrine-DM 30-2-10 MG/5ML syrup Take 5 mLs by mouth 4 (four) times daily as needed. 07/25/17   Evon Slack, PA-C  HYDROcodone-acetaminophen (NORCO/VICODIN) 5-325 MG tablet 1-2 tabs po q 8 hours prn 05/08/17   Payton Mccallum, MD  lisinopril (PRINIVIL,ZESTRIL) 5 MG tablet TAKE (1) TABLET BY MOUTH EVERY DAY 07/09/17   Babs Sciara, MD  Nutritional Supplements (JUICE PLUS FIBRE PO) Take 3 capsules  by mouth at bedtime. 1- Veggies, 1- Fruit, 1-Berry    [provider]  predniSONE (DELTASONE) 10 MG tablet Take 1 tablet (10 mg total) by mouth daily. 6,5,4,3,2,1 six day taper 07/25/17   Evon Slack, PA-C    Family History No family history on file.  Social History Social History   Tobacco Use  . Smoking status: Former Smoker    Last attempt to quit: 08/31/1984    Years since quitting: 32.9  . Smokeless tobacco: Former Neurosurgeon    Quit date: 02/05/1991  . Tobacco comment: only smoked for one year. quit in 1985. quit smokeless tobacco in  1992  Substance Use Topics  . Alcohol use: Yes    Alcohol/week: 2.4 oz    Types: 4 Cans of beer per week  . Drug use: Not on file     Allergies   Amoxicillin   Review of Systems Review of Systems  Constitutional: Negative for fever.  HENT: Positive for congestion, sinus pressure and sore throat. Negative for rhinorrhea and sinus pain.   Respiratory: Negative for cough, wheezing and stridor.   Gastrointestinal: Negative for diarrhea, nausea and vomiting.  Musculoskeletal: Negative for arthralgias, myalgias and neck stiffness.  Skin: Negative for rash.  Neurological: Negative for dizziness.     Physical Exam Triage Vital Signs ED Triage Vitals  Enc Vitals Group     BP 07/25/17 0822 133/85     Pulse Rate 07/25/17 0822 60     Resp 07/25/17 0822 16     Temp 07/25/17 0822 98.5 F (36.9 C)     Temp Source 07/25/17 0822 Oral     SpO2 07/25/17 0822 98 %     Weight 07/25/17 0824 (!) 331 lb (150.1 kg)     Height --      Head Circumference --      Peak Flow --      Pain Score 07/25/17 0824 6     Pain Loc --      Pain Edu? --      Excl. in GC? --    No data found.  Updated Vital Signs BP 133/85 (BP Location: Left Arm)   Pulse 60   Temp 98.5 F (36.9 C) (Oral)   Resp 16   Wt (!) 331 lb (150.1 kg)   SpO2 98%   BMI 46.17 kg/m   Visual Acuity Right Eye Distance:   Left Eye Distance:   Bilateral Distance:    Right  Eye Near:   Left Eye Near:    Bilateral Near:     Physical Exam  Constitutional: He is oriented to person, place, and time. He appears well-developed and well-nourished. No distress.  HENT:  Head: Normocephalic and atraumatic.  Right Ear: Hearing, tympanic membrane, external ear and ear canal normal.  Left Ear: Hearing, tympanic membrane, external ear and ear canal normal.  Nose: Rhinorrhea present. No nasal septal hematoma. Right sinus exhibits no maxillary sinus tenderness and no frontal sinus tenderness. Left sinus  exhibits no maxillary sinus tenderness and no frontal sinus tenderness.  Mouth/Throat: Oropharynx is clear and moist. No trismus in the jaw. No uvula swelling. No oropharyngeal exudate, posterior oropharyngeal edema, posterior oropharyngeal erythema or tonsillar abscesses.  No sinus tenderness to palpation.  Eyes: Conjunctivae are normal.  Neck: Normal range of motion.  Cardiovascular: Normal rate and regular rhythm.  Pulmonary/Chest: No stridor. No respiratory distress. He has no wheezes. He exhibits no tenderness.  Abdominal: Soft. He exhibits no distension. There is no tenderness. There is no guarding.  Musculoskeletal: Normal range of motion.  Lymphadenopathy:    He has no cervical adenopathy.  Neurological: He is alert and oriented to person, place, and time.  Skin: Skin is warm and dry. No rash noted.  Psychiatric: He has a normal mood and affect. His behavior is normal. Judgment and thought content normal.     UC Treatments / Results  Labs (all labs ordered are listed, but only abnormal results are displayed) Labs Reviewed  RAPID STREP SCREEN (NOT AT [facility])  CULTURE, GROUP A STREP [facility])    EKG  EKG Interpretation None       Radiology No results found.  Procedures Procedures (including critical care time)  Medications Ordered in UC Medications - No data to display   Initial Impression / Assessment and Plan / UC Course  I have reviewed the  triage vital signs and the nursing notes.  Pertinent labs & imaging results that were available during my care of the patient were reviewed by me and considered in my medical decision making (see chart for details).     57 year old male with viral pharyngitis.  Tolerating p.o. fluids well.  Complaining of some pain with swallowing pills.  Will give prednisone taper along with decongestant and antihistamine to help with sinus drainage and congestion.  Patient will continue with Nettie pot as needed.  He is educated on signs and symptoms to return to the clinic for.  Final Clinical Impressions(s) / UC Diagnoses   Final diagnoses:  Viral pharyngitis    ED Discharge Orders        Ordered    predniSONE (DELTASONE) 10 MG tablet  Daily     07/25/17 0857    brompheniramine-pseudoephedrine-DM 30-2-10 MG/5ML syrup  4 times daily PRN     07/25/17 0857        Evon Slack, PA-C 07/25/17 [phone]

## 2017-07-29 LAB — CULTURE, GROUP A STREP (THRC)

## 2017-08-13 ENCOUNTER — Encounter: Payer: Self-pay | Admitting: Family Medicine

## 2017-08-13 ENCOUNTER — Ambulatory Visit (INDEPENDENT_AMBULATORY_CARE_PROVIDER_SITE_OTHER): Payer: PRIVATE HEALTH INSURANCE | Admitting: Family Medicine

## 2017-08-13 VITALS — BP 132/90 | Ht 71.0 in | Wt 337.0 lb

## 2017-08-13 DIAGNOSIS — F988 Other specified behavioral and emotional disorders with onset usually occurring in childhood and adolescence: Secondary | ICD-10-CM

## 2017-08-13 MED ORDER — AMPHETAMINE-DEXTROAMPHET ER 10 MG PO CP24: 10.0000 mg | ORAL_CAPSULE | Freq: Every day | ORAL | 0 refills | Status: DC

## 2017-08-13 NOTE — Progress Notes (Signed)
   Subjective:    Patient ID: Phillip Spencer, male    DOB: [birth year], 57 y.o.   MRN: [id number]  HPIpt arrives today to discuss options for ADD.  I reviewed over his questionnaire with him Patient states he has had these symptoms and issues since elementary school He has difficult time staying on testing focus He requests medications to help him He understands the medications after a long discussion regarding purpose function side effects   Review of Systems  Constitutional: Negative for activity change, appetite change and fatigue.  HENT: Negative for congestion.   Respiratory: Negative for cough.   Gastrointestinal: Negative for abdominal pain, nausea and vomiting.  Neurological: Negative for headaches.  Psychiatric/Behavioral: Negative for behavioral problems.       Objective:   Physical Exam  Constitutional: He appears well-developed and well-nourished. No distress.  HENT:  Head: Normocephalic and atraumatic.  Eyes: Right eye exhibits no discharge. Left eye exhibits no discharge.  Cardiovascular: Normal rate, regular rhythm and normal heart sounds.  No murmur heard. Pulmonary/Chest: Effort normal and breath sounds normal. No respiratory distress. He has no wheezes.  Neurological: He is alert.  Skin: Skin is warm and dry.  Psychiatric: He has a normal mood and affect. His behavior is normal.    15 minutes was spent with patient today discussing healthcare issues which they came. Greater than half of the discussion was answering questions and giving management guidance.      Assessment & Plan:  ADD explanation of medications were given discussion held regarding his screening patient does choose to go ahead with treatment at this point Adderall  XR 10 mg daily discussion was held with patient when take medicine warning signs to watch for including insomnia headaches irritability increased blood pressure increased heart rate Recheck patient in 1 month  Patient  states he set himself up with the endocrinologist regarding his weight I believe it would be perfectly fine for him to be seen by them more than likely they will discuss medications with him patient is not interested in weight loss surgery

## 2017-08-17 ENCOUNTER — Telehealth: Payer: Self-pay | Admitting: Family Medicine

## 2017-08-17 NOTE — Telephone Encounter (Signed)
Rx prior auth APPROVED for pt's amphetamine-dextroamphetamine (ADDERALL XR) 10 MG 24 hr capsule Take 1 capsule (10 mg total) by mouth daily  Auth# [id number], valid 07/14/17-08/14/18 through [employer]  Faxed approval to [facility]. Sent to be scanned & filed

## 2017-08-21 ENCOUNTER — Other Ambulatory Visit: Payer: Self-pay | Admitting: Family Medicine

## 2017-09-10 ENCOUNTER — Encounter: Payer: Self-pay | Admitting: Family Medicine

## 2017-09-10 ENCOUNTER — Ambulatory Visit: Payer: PRIVATE HEALTH INSURANCE | Admitting: Family Medicine

## 2017-09-10 VITALS — BP 144/90 | Ht 71.0 in | Wt 327.0 lb

## 2017-09-10 DIAGNOSIS — R7303 Prediabetes: Secondary | ICD-10-CM | POA: Diagnosis not present

## 2017-09-10 DIAGNOSIS — F988 Other specified behavioral and emotional disorders with onset usually occurring in childhood and adolescence: Secondary | ICD-10-CM

## 2017-09-10 MED ORDER — AMPHETAMINE-DEXTROAMPHET ER 10 MG PO CP24: 10.0000 mg | ORAL_CAPSULE | Freq: Every day | ORAL | 0 refills | Status: DC

## 2017-09-10 NOTE — Progress Notes (Signed)
   Subjective:    Patient ID: Phillip Spencer, male    DOB: October 02, 1960, 57 y.o.   MRN: [id number]  HPIFollow up on ADD. Started on Adderall xr 10mg  at last visit. Pt states  No issues with med. Working well. Needs refills.  ADD medicines helping Denies any side effects Taking on a regular basis Does help cut down his appetite Denies elevated blood pressure headaches with this  The patient's BMI is calculated.  It is in the vital signs and acknowledged.  It is above the recommended BMI for the patient's height and weight.  The patient has been counseled regarding healthy diet, restricted portions, avoiding excessive carbohydrates/sugary foods, and increase physical activity as health permits.  It is in the patient's best interest to lower the risk of secondary illness including heart disease strokes and cancer by losing weight.  The patient acknowledges this information.  Review of Systems  Constitutional: Negative for activity change, appetite change and fatigue.  HENT: Negative for congestion.   Respiratory: Negative for cough.   Gastrointestinal: Negative for abdominal pain, nausea and vomiting.  Neurological: Negative for headaches.  Psychiatric/Behavioral: Negative for behavioral problems.       Objective:   Physical Exam  Constitutional: He appears well-nourished.  Cardiovascular: Normal rate, regular rhythm and normal heart sounds.  No murmur heard. Pulmonary/Chest: Effort normal and breath sounds normal.  Musculoskeletal: He exhibits no edema.  Lymphadenopathy:    He has no cervical adenopathy.  Neurological: He is alert.  Psychiatric: His behavior is normal.  Vitals reviewed.         Assessment & Plan:  Adult ADD-doing well on the medication.  Continue medication 3 prescriptions were given.  Drug registry was checked.  Patient states he will report to us if any side effects  Morbid obesity patient is sticking with a diet rich in vegetables with minimal amounts  of meats and avoiding starches.  He is also doing intermittent fasting he will follow-up with us again in several months if his endocrinologist does not do lab work we will do lab work before the next visit.

## 2017-10-19 ENCOUNTER — Other Ambulatory Visit: Payer: Self-pay | Admitting: Family Medicine

## 2017-11-17 ENCOUNTER — Telehealth: Payer: Self-pay | Admitting: *Deleted

## 2017-11-17 DIAGNOSIS — E785 Hyperlipidemia, unspecified: Secondary | ICD-10-CM

## 2017-11-17 DIAGNOSIS — Z79899 Other long term (current) drug therapy: Secondary | ICD-10-CM

## 2017-11-17 DIAGNOSIS — R7303 Prediabetes: Secondary | ICD-10-CM

## 2017-11-17 NOTE — Telephone Encounter (Signed)
Patient called stating [name] told him to call and order his lab work for the 30th of may. Please advise

## 2017-11-17 NOTE — Telephone Encounter (Signed)
Lipid, liver, metabolic panel, A1c-hyperlipidemia hypertension and diabetes

## 2017-11-17 NOTE — Telephone Encounter (Signed)
Last labs lipid, liver, bmp, a1c, cbc, psa, microalb  Dec 2018

## 2017-11-18 NOTE — Telephone Encounter (Signed)
Patient aware.

## 2017-11-18 NOTE — Telephone Encounter (Signed)
I called left message to r/c.Ordered in computer.

## 2017-12-05 LAB — BASIC METABOLIC PANEL
BUN/Creatinine Ratio: 12 (ref 9–20)
BUN: 9 mg/dL (ref 6–24)
CO2: 25 mmol/L (ref 20–29)
CREATININE: 0.76 mg/dL (ref 0.76–1.27)
Calcium: 9.5 mg/dL (ref 8.7–10.2)
Chloride: 106 mmol/L (ref 96–106)
GFR calc Af Amer: 118 mL/min/{1.73_m2} (ref >59)
GFR, EST NON AFRICAN AMERICAN: 102 mL/min/{1.73_m2} (ref >59)
GLUCOSE: 95 mg/dL (ref 65–99)
Potassium: 3.8 mmol/L (ref 3.5–5.2)
Sodium: 147 mmol/L — ABNORMAL HIGH (ref 134–144)

## 2017-12-05 LAB — LIPID PANEL
CHOL/HDL RATIO: 3.3 ratio (ref 0.0–5.0)
Cholesterol, Total: 217 mg/dL — ABNORMAL HIGH (ref 100–199)
HDL: 66 mg/dL (ref >39)
LDL CALC: 122 mg/dL — AB (ref 0–99)
Triglycerides: 145 mg/dL (ref 0–149)
VLDL Cholesterol Cal: 29 mg/dL (ref 5–40)

## 2017-12-05 LAB — HEPATIC FUNCTION PANEL
ALBUMIN: 4.4 g/dL (ref 3.5–5.5)
ALK PHOS: 121 IU/L — AB (ref 39–117)
ALT: 95 IU/L — ABNORMAL HIGH (ref 0–44)
AST: 54 IU/L — ABNORMAL HIGH (ref 0–40)
BILIRUBIN, DIRECT: 0.24 mg/dL (ref 0.00–0.40)
Bilirubin Total: 0.8 mg/dL (ref 0.0–1.2)
TOTAL PROTEIN: 6.8 g/dL (ref 6.0–8.5)

## 2017-12-05 LAB — HEMOGLOBIN A1C
ESTIMATED AVERAGE GLUCOSE: 111 mg/dL
HEMOGLOBIN A1C: 5.5 % (ref 4.8–5.6)

## 2017-12-11 ENCOUNTER — Ambulatory Visit: Payer: PRIVATE HEALTH INSURANCE | Admitting: Family Medicine

## 2017-12-11 ENCOUNTER — Encounter: Payer: Self-pay | Admitting: Family Medicine

## 2017-12-11 VITALS — BP 130/88 | Ht 71.0 in | Wt 297.6 lb

## 2017-12-11 DIAGNOSIS — E785 Hyperlipidemia, unspecified: Secondary | ICD-10-CM | POA: Diagnosis not present

## 2017-12-11 DIAGNOSIS — R748 Abnormal levels of other serum enzymes: Secondary | ICD-10-CM | POA: Diagnosis not present

## 2017-12-11 DIAGNOSIS — F909 Attention-deficit hyperactivity disorder, unspecified type: Secondary | ICD-10-CM | POA: Insufficient documentation

## 2017-12-11 DIAGNOSIS — R7303 Prediabetes: Secondary | ICD-10-CM

## 2017-12-11 DIAGNOSIS — E7849 Other hyperlipidemia: Secondary | ICD-10-CM | POA: Diagnosis not present

## 2017-12-11 DIAGNOSIS — Z79899 Other long term (current) drug therapy: Secondary | ICD-10-CM

## 2017-12-11 MED ORDER — ATORVASTATIN CALCIUM 40 MG PO TABS: ORAL_TABLET | ORAL | 5 refills | Status: DC

## 2017-12-11 MED ORDER — AMPHETAMINE-DEXTROAMPHET ER 10 MG PO CP24: 10.0000 mg | ORAL_CAPSULE | Freq: Every day | ORAL | 0 refills | Status: DC

## 2017-12-11 NOTE — Progress Notes (Signed)
Subjective:    Patient ID: Phillip Spencer, male    DOB: September 21, 1960, 57 y.o.   MRN: [id number]  HPIADD check up. Pt states no concerns today. Just needs refill on adderall xr 10mg .  Patient has adult ADD takes medication he states that helps him greatly with focusing and following through he states without it he would have a very difficult time  Hyperlipidemia he does take medicine he does note to watch his diet he is trying to lose some weight he denies any complications from the medicine  Recent lab work showed elevated liver enzymes he never had this problem when he was younger.  No family history of it.  Denies abusing alcohol  Prediabetes patient has been trying to keep sugar under control by minimizing starches in the diet exercise and trying to lose weight denies any complications  Morbid obesity patient understands the importance of losing weight to lessen his risk of long-term complications and health issues he is trying to exercise watch diet lose weight  Patient under a lot of stress because he works a tremendous amount of hours and does not get much downtime but denies being depressed or anxious.   Review of Systems  Constitutional: Negative for activity change, appetite change and fatigue.  HENT: Negative for congestion and rhinorrhea.   Respiratory: Negative for cough, chest tightness and shortness of breath.   Cardiovascular: Negative for chest pain and leg swelling.  Gastrointestinal: Negative for abdominal pain, diarrhea and nausea.  Endocrine: Negative for polydipsia and polyphagia.  Genitourinary: Negative for dysuria and hematuria.  Neurological: Negative for weakness and headaches.  Psychiatric/Behavioral: Negative for confusion and dysphoric mood.       Objective:   Physical Exam  Constitutional: He appears well-nourished. No distress.  Cardiovascular: Normal rate, regular rhythm and normal heart sounds.  No murmur heard. Pulmonary/Chest: Effort normal  and breath sounds normal. No respiratory distress.  Musculoskeletal: He exhibits no edema.  Lymphadenopathy:    He has no cervical adenopathy.  Neurological: He is alert.  Psychiatric: His behavior is normal.  Vitals reviewed.     25 minutes was spent with the patient.  This statement verifies that 25 minutes was indeed spent with the patient. Greater than half the time was spent in discussion, counseling and answering questions  regarding the issues that the patient came in for today as reflected in the diagnosis (s) please refer to documentation for further details. Significant time spent regarding weight discussion exercise losing weight liver enzymes following through on ADD etc.     Assessment & Plan:  Elevated liver enzymes-I am concerned about this more than likely fatty liver we talked about at length he is going to work hard on his diet exercise and losing weight we will repeat lab work again in approximately 3 months with a follow-up office visit if he still has elevated liver enzymes next step will be ultrasound and significant additional blood work patient is aware that he failure to get this under control increases risk of cirrhosis and potentially death  Hyperlipidemia takes his medication watch his diet we need to adjust the statin to get the LDL below 100  Prediabetes under good control with diet measures  Morbid obesity patient is trying hard to lose weight  Adult ADD 3 prescriptions were given he is to follow-up in approximately 3 months  Patient working excessive amount of hours this is causing significant problems for him.  Very important for him to try to do the  best he can occur tailing his work hours

## 2018-01-08 ENCOUNTER — Ambulatory Visit: Payer: PRIVATE HEALTH INSURANCE | Admitting: Family Medicine

## 2018-01-22 ENCOUNTER — Encounter: Payer: Self-pay | Admitting: Emergency Medicine

## 2018-01-22 ENCOUNTER — Ambulatory Visit
Admission: EM | Admit: 2018-01-22 | Discharge: 2018-01-22 | Disposition: A | Discharge status: Home / Self Care | Payer: PRIVATE HEALTH INSURANCE | Attending: Emergency Medicine | Admitting: Emergency Medicine

## 2018-01-22 ENCOUNTER — Other Ambulatory Visit: Payer: Self-pay

## 2018-01-22 DIAGNOSIS — J0100 Acute maxillary sinusitis, unspecified: Secondary | ICD-10-CM | POA: Diagnosis not present

## 2018-01-22 MED ORDER — FLUTICASONE PROPIONATE 50 MCG/ACT NA SUSP: 2.0000 | Freq: Every day | NASAL | 0 refills | Status: DC

## 2018-01-22 MED ORDER — IBUPROFEN 600 MG PO TABS: 600.0000 mg | ORAL_TABLET | Freq: Four times a day (QID) | ORAL | 0 refills | Status: DC | PRN

## 2018-01-22 NOTE — ED Triage Notes (Signed)
Patient c/o sinus congestion and pressure, HAs, cough, and runny nose for the past 2 days.  Patient reports chills.

## 2018-01-22 NOTE — ED Provider Notes (Signed)
HPI  SUBJECTIVE:  Phillip Spencer is a 57 y.o. male who presents with headache, sinus pain and pressure, chills, nasal congestion, rhinorrhea, postnasal drip starting yesterday.  States that he feels feverish but never took his temperature.  No facial swelling, upper dental pain.  No allergy symptoms.  No sore throat.  He tried DayQuil, NyQuil, ibuprofen and using a Neti pot with some improvement in his symptoms.  No aggravating factors.  He has taken an antipyretic in the past 6 to 8 hours.  States that he has used Flonase in the past but is not currently using it.  Past medical history of recurrent sinusitis status post sinus surgery.  He states that he had allergies "a long time ago", hypertension.  No history of diabetes.  ZOX:WRUEAV, Jonna Coup, MD   Past Medical History:  Diagnosis Date  . GERD (gastroesophageal reflux disease)   . Hematuria   . Hyperlipidemia   . Pre-diabetes   . Renal calculus, right     Past Surgical History:  Procedure Laterality Date  . CHOLECYSTECTOMY  2011  . CYSTOSCOPY WITH RETROGRADE PYELOGRAM, URETEROSCOPY AND STENT PLACEMENT Right 09/06/2014   Procedure: CYSTOSCOPY WITH RETROGRADE PYELOGRAM, RIGHT DIAGNOSTIC URETEROSCOPY AND STENT PLACEMENT;  Surgeon: Sebastian Ache, MD;  Location: [facility];  Service: Urology;  Laterality: Right;  . CYSTOSCOPY WITH RETROGRADE PYELOGRAM, URETEROSCOPY AND STENT PLACEMENT Right 09/27/2014   Procedure: CYSTOSCOPY WITH RETROGRADE PYELOGRAM, RIGHT URETEROSCOPY AND STENT EXCHANGE, LASER LITHOTRIPSY WITH STONE BASKETRY;  Surgeon: Sebastian Ache, MD;  Location: [facility];  Service: Urology;  Laterality: Right;  . FOOT SURGERY Bilateral   . HERNIA REPAIR     ventral x 2  . HOLMIUM LASER APPLICATION Right 09/27/2014   Procedure: HOLMIUM LASER APPLICATION;  Surgeon: Sebastian Ache, MD;  Location: [facility];  Service: Urology;  Laterality: Right;  . KNEE ARTHROSCOPY Right   . NASAL  SINUS SURGERY      History reviewed. No pertinent family history.  Social History   Tobacco Use  . Smoking status: Former Smoker    Last attempt to quit: 08/31/1984    Years since quitting: 33.4  . Smokeless tobacco: Former Neurosurgeon    Quit date: 02/05/1991  . Tobacco comment: only smoked for one year. quit in 1985. quit smokeless tobacco in  1992  Substance Use Topics  . Alcohol use: Yes    Alcohol/week: 2.4 oz    Types: 4 Cans of beer per week  . Drug use: Not on file    No current facility-administered medications for this encounter.   Current Outpatient Medications:  .  amphetamine-dextroamphetamine (ADDERALL XR) 10 MG 24 hr capsule, Take 1 capsule (10 mg total) by mouth daily., Disp: 30 capsule, Rfl: 0 .  atorvastatin (LIPITOR) 40 MG tablet, 1 qd, Disp: 30 tablet, Rfl: 5 .  cholestyramine (QUESTRAN) 4 GM/DOSE powder, Take by mouth at bedtime., Disp: , Rfl:  .  Cyanocobalamin (B-12 PO), Take by mouth., Disp: , Rfl:  .  esomeprazole (NEXIUM) 40 MG capsule, Take 40 mg by mouth daily before breakfast., Disp: , Rfl:  .  lisinopril (PRINIVIL,ZESTRIL) 5 MG tablet, TAKE (1) TABLET BY MOUTH EVERY DAY, Disp: 30 tablet, Rfl: 5 .  Nutritional Supplements (JUICE PLUS FIBRE PO), Take 3 capsules by mouth at bedtime. 1- Veggies, 1- Fruit, 1-Berry, Disp: , Rfl:  .  Omega-3 Fatty Acids (FISH OIL) 1200 MG CAPS, Take by mouth., Disp: , Rfl:  .  aspirin 81 MG tablet,  Take 81 mg by mouth daily., Disp: , Rfl:  .  fluticasone (FLONASE) 50 MCG/ACT nasal spray, Place 2 sprays into both nostrils daily., Disp: 16 g, Rfl: 0 .  ibuprofen (ADVIL,MOTRIN) 600 MG tablet, Take 1 tablet (600 mg total) by mouth every 6 (six) hours as needed., Disp: 30 tablet, Rfl: 0  Allergies  Allergen Reactions  . Amoxicillin Nausea And Vomiting     ROS  As noted in HPI.   Physical Exam  BP (!) 124/91 (BP Location: Left Arm)   Pulse 76   Temp 98.6 F (37 C) (Oral)   Resp 16   Ht 6' (1.829 m)   Wt 295 lb (133.8 kg)    SpO2 95%   BMI 40.01 kg/m   Constitutional: Well developed, well nourished, no acute distress Eyes:  EOMI, conjunctiva normal bilaterally HENT: Normocephalic, atraumatic,mucus membranes moist.  Positive maxillary sinus tenderness.  No frontal sinus tenderness.  Positive mucoid nasal congestion.  Normal turbinates.  Positive postnasal drip. Respiratory: Normal inspiratory effort Cardiovascular: Normal rate GI: nondistended skin: No rash, skin intact Musculoskeletal: no deformities Neurologic: Alert & oriented x 3, no focal neuro deficits Psychiatric: Speech and behavior appropriate   ED Course   Medications - No data to display  No orders of the defined types were placed in this encounter.   No results found for this or any previous visit (from the past 24 hour(s)). No results found.  ED Clinical Impression  Acute non-recurrent maxillary sinusitis   ED Assessment/Plan  Presentation most consistent with maxillary sinusitis most likely viral at this point in time.  Discussed this with patient.  We will start him on Mucinex D as long as he watches his blood pressure if it becomes elevated he will switch to regular Mucinex, will start Flonase, continue saline nasal irrigation, ibuprofen 600 mg to take with 1 g of Tylenol together 3 or 4 times a day as needed, discontinue all other cold medications.  Follow-up here with his doctor if not better in 10 days we can consider antibiotics at that time.  Patient agrees with plan.   Meds ordered this encounter  Medications  . fluticasone (FLONASE) 50 MCG/ACT nasal spray    Sig: Place 2 sprays into both nostrils daily.    Dispense:  16 g    Refill:  0  . ibuprofen (ADVIL,MOTRIN) 600 MG tablet    Sig: Take 1 tablet (600 mg total) by mouth every 6 (six) hours as needed.    Dispense:  30 tablet    Refill:  0    *This clinic note was created using Scientist, clinical (histocompatibility and immunogenetics)Dragon dictation software. Therefore, there may be occasional mistakes despite careful  proofreading.   ?   Domenick GongMortenson, Shannah Conteh, MD 01/22/18 1051

## 2018-01-22 NOTE — Discharge Instructions (Addendum)
Maximum strength Mucinex D 1200 mg / 120 mg of pseudoephedrine twice a day.  Drink plenty of extra fluids.Marland Kitchen.  Keep an eye on your blood pressure.  If becomes elevated, stop the Mucinex D and start regular Mucinex 1200 mg twice a day. start Flonase, continue saline nasal irrigation, ibuprofen 600 mg to take with 1 g of Tylenol together 3 or 4 times a day as needed, discontinue all other cold medications.  Follow-up here for with your doctor if not better in 10 days, if you start having fevers above 102, if you get better and then get worse, and we can consider antibiotics at that time

## 2018-03-18 ENCOUNTER — Ambulatory Visit: Payer: PRIVATE HEALTH INSURANCE | Admitting: Family Medicine

## 2018-03-31 ENCOUNTER — Telehealth: Payer: Self-pay | Admitting: Family Medicine

## 2018-03-31 NOTE — Telephone Encounter (Signed)
Patient is requesting refill adderall 10 mg 24 hour capsules completely out has appointment on 9/25

## 2018-03-31 NOTE — Telephone Encounter (Signed)
Ok times one mo in scotts name

## 2018-04-01 MED ORDER — AMPHETAMINE-DEXTROAMPHET ER 10 MG PO CP24: 10.0000 mg | ORAL_CAPSULE | Freq: Every day | ORAL | 0 refills | Status: DC

## 2018-04-01 NOTE — Telephone Encounter (Signed)
Prescription upfront for pick up. Patient notified. 

## 2018-04-01 NOTE — Addendum Note (Signed)
Addended by: Margaretha SheffieldBROWN, AUTUMN S on: 04/01/2018 09:31 AM   Modules accepted: Orders

## 2018-04-02 ENCOUNTER — Other Ambulatory Visit: Payer: Self-pay | Admitting: Family Medicine

## 2018-04-06 LAB — HEPATIC FUNCTION PANEL
ALK PHOS: 104 IU/L (ref 39–117)
ALT: 70 IU/L — AB (ref 0–44)
AST: 52 IU/L — AB (ref 0–40)
Albumin: 4.3 g/dL (ref 3.5–5.5)
BILIRUBIN, DIRECT: 0.14 mg/dL (ref 0.00–0.40)
Bilirubin Total: 0.4 mg/dL (ref 0.0–1.2)
TOTAL PROTEIN: 6.6 g/dL (ref 6.0–8.5)

## 2018-04-06 LAB — LIPID PANEL
CHOLESTEROL TOTAL: 178 mg/dL (ref 100–199)
Chol/HDL Ratio: 3 ratio (ref 0.0–5.0)
HDL: 59 mg/dL (ref >39)
LDL Calculated: 69 mg/dL (ref 0–99)
TRIGLYCERIDES: 251 mg/dL — AB (ref 0–149)
VLDL Cholesterol Cal: 50 mg/dL — ABNORMAL HIGH (ref 5–40)

## 2018-04-07 ENCOUNTER — Ambulatory Visit: Payer: PRIVATE HEALTH INSURANCE | Admitting: Family Medicine

## 2018-04-07 ENCOUNTER — Encounter: Payer: Self-pay | Admitting: Family Medicine

## 2018-04-07 ENCOUNTER — Telehealth: Payer: Self-pay | Admitting: *Deleted

## 2018-04-07 VITALS — BP 128/84 | Ht 71.0 in | Wt 293.8 lb

## 2018-04-07 DIAGNOSIS — E7849 Other hyperlipidemia: Secondary | ICD-10-CM | POA: Diagnosis not present

## 2018-04-07 DIAGNOSIS — R748 Abnormal levels of other serum enzymes: Secondary | ICD-10-CM

## 2018-04-07 DIAGNOSIS — F909 Attention-deficit hyperactivity disorder, unspecified type: Secondary | ICD-10-CM

## 2018-04-07 NOTE — Progress Notes (Signed)
Subjective:    Patient ID: Phillip Spencer, male    DOB: 12/25/1960, 57 y.o.   MRN: [id number]  HPI  Patient arrives for a follow up on ADHD. Patient states he is doing well on current meds. Patient also would like to discuss results of recent labs.  Patient has adult ADD.  Patient states medication helps him focus Denies any trouble with the medicine not addicted to it Keeps medicine in the same spot  Patient here for follow-up regarding cholesterol.  The patient does have hyperlipidemia.  Patient does try to maintain a reasonable diet.  Patient does take the medication on a regular basis.  Denies missing a dose.  The patient denies any obvious side effects.  Prior blood work results reviewed with the patient.  The patient is aware of his cholesterol goals and the need to keep it under good control to lessen the risk of dis  Patient with morbid obesity He is watching his calories and he is trying to be healthy is trying to lose weight  Elevated liver enzymes fatty liver issue needs further work-up.  Patient trying to minimize starches in the diet also trying to minimize alcohol use.  Review of Systems  Constitutional: Negative for diaphoresis and fatigue.  HENT: Negative for congestion and rhinorrhea.   Respiratory: Negative for cough and shortness of breath.   Cardiovascular: Negative for chest pain and leg swelling.  Gastrointestinal: Negative for abdominal pain and diarrhea.  Skin: Negative for color change and rash.  Neurological: Negative for dizziness and headaches.  Psychiatric/Behavioral: Negative for behavioral problems and confusion.       Objective:   Physical Exam  Constitutional: He appears well-nourished. No distress.  HENT:  Head: Normocephalic and atraumatic.  Eyes: Right eye exhibits no discharge. Left eye exhibits no discharge.  Neck: No tracheal deviation present.  Cardiovascular: Normal rate, regular rhythm and normal heart sounds.  No murmur  heard. Pulmonary/Chest: Effort normal and breath sounds normal. No respiratory distress.  Abdominal: Soft. He exhibits no distension. There is no tenderness.  Musculoskeletal: He exhibits no edema.  Lymphadenopathy:    He has no cervical adenopathy.  Neurological: He is alert. Coordination normal.  Skin: Skin is warm and dry.  Psychiatric: He has a normal mood and affect. His behavior is normal.  Vitals reviewed.  Patient here for follow-up regarding cholesterol.  The patient does have hyperlipidemia.  Patient does try to maintain a reasonable diet.  Patient does take the medication on a regular basis.  Denies missing a dose.  The patient denies any obvious side effects.  Prior blood work results reviewed with the patient.  The patient is aware of his cholesterol goals and the need to keep it under good control to lessen the risk of disease.  Patient for blood pressure check up.  The patient does have hypertension.  The patient is on medication.  Patient relates compliance with meds. Todays BP reviewed with the patient. Patient denies issues with medication. Patient relates reasonable diet. Patient tries to minimize salt. Patient aware of BP goals.  Lab work was reviewed with the patient liver enzymes moderately elevated Triglycerides moderately elevated patient encouraged to minimize starches at diet LDL looks good  25 minutes was spent with the patient.  This statement verifies that 25 minutes was indeed spent with the patient.  More than 50% of this visit-total duration of the visit-was spent in counseling and coordination of care. The issues that the patient came in for today  as reflected in the diagnosis (s) please refer to documentation for further details.      Assessment & Plan:  Morbid obesity Dietary measures activity work on bringing weight down  Elevated liver enzymes ultrasound lab work indicated.  Work on bringing weight down more than likely fatty liver  ADD adult-does  well with medications Continue current measures Will send in 3 prescriptions to his pharmacy  Hyperlipidemia continue atorvastatin on a regular basis previous labs reviewed.  Follow-up again in several months  Patient was counseled on cutting back on alcohol use

## 2018-04-07 NOTE — Telephone Encounter (Signed)
Patient needs U/S RUQ with elastrograpphy scheduled at [facility] but will call in with work schedule and days he would like it scheduled (already ordered in Epic)

## 2018-04-11 MED ORDER — AMPHETAMINE-DEXTROAMPHET ER 10 MG PO CP24: 10.0000 mg | ORAL_CAPSULE | Freq: Every day | ORAL | 0 refills | Status: DC

## 2018-04-13 NOTE — Telephone Encounter (Signed)
Patient informed referral coordinator of dates he was available to schedule ultrasound.

## 2018-04-30 ENCOUNTER — Ambulatory Visit (HOSPITAL_COMMUNITY)
Admission: RE | Admit: 2018-04-30 | Discharge: 2018-04-30 | Disposition: A | Discharge status: Home / Self Care | Payer: PRIVATE HEALTH INSURANCE | Source: Ambulatory Visit | Attending: Family Medicine | Admitting: Family Medicine

## 2018-04-30 DIAGNOSIS — K760 Fatty (change of) liver, not elsewhere classified: Secondary | ICD-10-CM | POA: Diagnosis not present

## 2018-04-30 DIAGNOSIS — R748 Abnormal levels of other serum enzymes: Secondary | ICD-10-CM | POA: Insufficient documentation

## 2018-04-30 DIAGNOSIS — Z9049 Acquired absence of other specified parts of digestive tract: Secondary | ICD-10-CM | POA: Insufficient documentation

## 2018-05-02 ENCOUNTER — Encounter: Payer: Self-pay | Admitting: Family Medicine

## 2018-05-02 LAB — IRON AND TIBC
IRON SATURATION: 27 % (ref 15–55)
IRON: 89 ug/dL (ref 38–169)
TIBC: 324 ug/dL (ref 250–450)
UIBC: 235 ug/dL (ref 111–343)

## 2018-05-02 LAB — ALPHA-1-ANTITRYPSIN: A1 ANTITRYPSIN: 125 mg/dL (ref 90–200)

## 2018-05-02 LAB — FERRITIN: Ferritin: 247 ng/mL (ref 30–400)

## 2018-05-02 LAB — HEPATITIS C ANTIBODY: Hep C Virus Ab: <0.1 s/co ratio (ref 0.0–0.9)

## 2018-05-02 LAB — ANTI-SMOOTH MUSCLE ANTIBODY, IGG: Smooth Muscle Ab: 4 Units (ref 0–19)

## 2018-05-02 LAB — TSH: TSH: 1.87 u[IU]/mL (ref 0.450–4.500)

## 2018-05-02 LAB — HEPATITIS B SURFACE ANTIGEN: Hepatitis B Surface Ag: NEGATIVE

## 2018-05-02 LAB — CERULOPLASMIN: Ceruloplasmin: 20.8 mg/dL (ref 16.0–31.0)

## 2018-05-03 ENCOUNTER — Other Ambulatory Visit: Payer: Self-pay | Admitting: Family Medicine

## 2018-05-03 DIAGNOSIS — R7303 Prediabetes: Secondary | ICD-10-CM

## 2018-05-03 DIAGNOSIS — E7849 Other hyperlipidemia: Secondary | ICD-10-CM

## 2018-05-03 DIAGNOSIS — Z79899 Other long term (current) drug therapy: Secondary | ICD-10-CM

## 2018-05-03 DIAGNOSIS — Z125 Encounter for screening for malignant neoplasm of prostate: Secondary | ICD-10-CM

## 2018-05-14 ENCOUNTER — Ambulatory Visit (INDEPENDENT_AMBULATORY_CARE_PROVIDER_SITE_OTHER): Payer: PRIVATE HEALTH INSURANCE | Admitting: Family Medicine

## 2018-05-14 ENCOUNTER — Encounter: Payer: Self-pay | Admitting: Family Medicine

## 2018-05-14 VITALS — BP 132/90 | Ht 71.0 in | Wt 303.0 lb

## 2018-05-14 DIAGNOSIS — Z0000 Encounter for general adult medical examination without abnormal findings: Secondary | ICD-10-CM | POA: Diagnosis not present

## 2018-05-14 NOTE — Progress Notes (Signed)
   Subjective:    Patient ID: Phillip Spencer, male    DOB: Jun 08, 1961, 57 y.o.   MRN: [id number]  HPI The patient comes in today for a wellness visit.    A review of their health history was completed.  A review of medications was also completed.  Any needed refills; none at this time  Eating habits: health conscious  Falls/  MVA accidents in past few months: none  Regular exercise: just restarted exercise program  Specialist pt sees on regular basis: Laurette Schimke, every 3 years  Preventative health issues were discussed.   Additional concerns: none Please see his physical questionnaire  Review of Systems  Constitutional: Negative for activity change, appetite change and fever.  HENT: Negative for congestion and rhinorrhea.   Eyes: Negative for discharge.  Respiratory: Negative for cough and wheezing.   Cardiovascular: Negative for chest pain.  Gastrointestinal: Negative for abdominal pain, blood in stool and vomiting.  Genitourinary: Negative for difficulty urinating and frequency.  Musculoskeletal: Negative for neck pain.  Skin: Negative for rash.  Allergic/Immunologic: Negative for environmental allergies and food allergies.  Neurological: Negative for weakness and headaches.  Psychiatric/Behavioral: Negative for agitation.       Objective:   Physical Exam  Constitutional: He appears well-developed and well-nourished.  HENT:  Head: Normocephalic and atraumatic.  Right Ear: External ear normal.  Left Ear: External ear normal.  Nose: Nose normal.  Mouth/Throat: Oropharynx is clear and moist.  Eyes: Pupils are equal, round, and reactive to light. EOM are normal.  Neck: Normal range of motion. Neck supple. No thyromegaly present.  Cardiovascular: Normal rate, regular rhythm and normal heart sounds.  No murmur heard. Pulmonary/Chest: Effort normal and breath sounds normal. No respiratory distress. He has no wheezes.  Abdominal: Soft. Bowel sounds are normal. He  exhibits no distension and no mass. There is no tenderness.  Genitourinary: Penis normal.  Musculoskeletal: Normal range of motion. He exhibits no edema.  Lymphadenopathy:    He has no cervical adenopathy.  Neurological: He is alert. He exhibits normal muscle tone.  Skin: Skin is warm and dry. No erythema.  Psychiatric: He has a normal mood and affect. His behavior is normal. Judgment normal.     Prostate exam normal     Assessment & Plan:  Adult wellness-complete.wellness physical was conducted today. Importance of diet and exercise were discussed in detail.  In addition to this a discussion regarding safety was also covered. We also reviewed over immunizations and gave recommendations regarding current immunization needed for age.  In addition to this additional areas were also touched on including: Preventative health exams needed:  Colonoscopy 2023  Patient was advised yearly wellness exam  Patient does have fatty liver and ADD will do other office visits to follow-up on that  Lab work pending including PSA  Patient encouraged to continue exercising watching diet trying to lose weight  Patient encouraged to cut back on alcohol use as much as possible

## 2018-06-14 ENCOUNTER — Other Ambulatory Visit: Payer: Self-pay

## 2018-06-14 ENCOUNTER — Telehealth: Payer: Self-pay | Admitting: Family Medicine

## 2018-06-14 DIAGNOSIS — R7303 Prediabetes: Secondary | ICD-10-CM

## 2018-06-14 DIAGNOSIS — Z125 Encounter for screening for malignant neoplasm of prostate: Secondary | ICD-10-CM

## 2018-06-14 DIAGNOSIS — R5383 Other fatigue: Secondary | ICD-10-CM

## 2018-06-14 DIAGNOSIS — Z79899 Other long term (current) drug therapy: Secondary | ICD-10-CM

## 2018-06-14 NOTE — Telephone Encounter (Signed)
Separated and called the pt to let him know that I have printed the orders separately and he will come by the office to pick those up.

## 2018-06-14 NOTE — Telephone Encounter (Signed)
Please go ahead and separate out these orders and have the patient go ahead with getting PSA

## 2018-06-14 NOTE — Telephone Encounter (Signed)
Please advise 

## 2018-06-14 NOTE — Telephone Encounter (Signed)
Pt has an order for blood work for PSA, Hepatic function panel and A1C. Pt would like to have a sperate order placed for PSA due to needing that test done for work, pt would like to wait closer to appt to get other two testings done. Advise.

## 2018-06-22 ENCOUNTER — Encounter: Payer: Self-pay | Admitting: Family Medicine

## 2018-06-22 LAB — PSA: Prostate Specific Ag, Serum: 0.4 ng/mL (ref 0.0–4.0)

## 2018-07-10 ENCOUNTER — Other Ambulatory Visit: Payer: Self-pay | Admitting: Family Medicine

## 2018-07-22 ENCOUNTER — Telehealth: Payer: Self-pay | Admitting: Family Medicine

## 2018-07-22 NOTE — Telephone Encounter (Signed)
Fax from [place] states the PA for Amphetamine Dextroamphetamine has been denied. Fax states that med would be covered if one of the following have been met: Symptoms have been present prior to 56 year of age and/or the drug has been prescribed/recommended by mental health specialist. Please advise. Thank you

## 2018-07-25 NOTE — Telephone Encounter (Signed)
Nurses Please communicate with the patient Let the patient know that ADD medicines for adult is coming under greater control by insurance companies Currently they are mandating that patient have DSM-V documentation for ADD Even when this documentation is completed there are cases where it is still denied by insurance companies and the only way to pursue forward if the patient desires to stay with the medicine would be to be seen by a mental health specialist  So at this point I recommend an office visit devoted toward discussing ADD in further detail with him filling out a form plus also documentation Then we will resubmit this If it is not approved at that point then the next step would be to see mental health specialist if he desires to stay with the medicine He does have a follow-up appointment in early February if he would like to wait till then to cover at that time otherwise I would recommend a separate appointment for this issue

## 2018-07-26 NOTE — Telephone Encounter (Signed)
Left message to return call 

## 2018-07-27 NOTE — Telephone Encounter (Signed)
Pt returned call. Pt advised that adult add med is under greater control and provider recommends an office visit dedicated to ADD. Pt verbalized understanding and was transferred up front to get appt.

## 2018-07-27 NOTE — Telephone Encounter (Signed)
Left message to return call. Pt has appt on 08/17/2018 for med check. Need to see if this is for ADD.

## 2018-07-30 ENCOUNTER — Ambulatory Visit: Payer: PRIVATE HEALTH INSURANCE | Admitting: Family Medicine

## 2018-08-04 ENCOUNTER — Other Ambulatory Visit: Payer: Self-pay | Admitting: Family Medicine

## 2018-08-06 NOTE — Telephone Encounter (Signed)
One mo worth 

## 2018-08-06 NOTE — Telephone Encounter (Signed)
Checking on status of medication.  Next appt: 08/13/18

## 2018-08-06 NOTE — Telephone Encounter (Signed)
Awaiting signature.

## 2018-08-11 LAB — HEPATIC FUNCTION PANEL
ALT: 36 IU/L (ref 0–44)
AST: 25 IU/L (ref 0–40)
Albumin: 4.7 g/dL (ref 3.8–4.9)
Alkaline Phosphatase: 109 IU/L (ref 39–117)
Bilirubin Total: 0.9 mg/dL (ref 0.0–1.2)
Bilirubin, Direct: 0.22 mg/dL (ref 0.00–0.40)
TOTAL PROTEIN: 7.1 g/dL (ref 6.0–8.5)

## 2018-08-11 LAB — HEMOGLOBIN A1C
ESTIMATED AVERAGE GLUCOSE: 117 mg/dL
HEMOGLOBIN A1C: 5.7 % — AB (ref 4.8–5.6)

## 2018-08-13 ENCOUNTER — Ambulatory Visit: Payer: PRIVATE HEALTH INSURANCE | Admitting: Family Medicine

## 2018-08-13 ENCOUNTER — Encounter: Payer: Self-pay | Admitting: Family Medicine

## 2018-08-13 VITALS — BP 128/74 | Ht 71.0 in | Wt 314.8 lb

## 2018-08-13 DIAGNOSIS — R7303 Prediabetes: Secondary | ICD-10-CM

## 2018-08-13 DIAGNOSIS — F909 Attention-deficit hyperactivity disorder, unspecified type: Secondary | ICD-10-CM

## 2018-08-13 DIAGNOSIS — E7849 Other hyperlipidemia: Secondary | ICD-10-CM

## 2018-08-13 MED ORDER — AMPHETAMINE-DEXTROAMPHET ER 10 MG PO CP24: 10.0000 mg | ORAL_CAPSULE | Freq: Every day | ORAL | 0 refills | Status: DC

## 2018-08-13 MED ORDER — AMPHETAMINE-DEXTROAMPHET ER 10 MG PO CP24: ORAL_CAPSULE | ORAL | 0 refills | Status: DC

## 2018-08-13 NOTE — Progress Notes (Signed)
Subjective:    Patient ID: Phillip Spencer, male    DOB: Mar 28, 1961, 58 y.o.   MRN: [id number]  HPIAdult ADD checkup. ADD med was denied by insurance. He is taking adderall xr 10mg  one daily.   Had bloodwork done 08/10/18.   This patient was diagnosed several months ago with adult ADD.  He has benefited greatly from the medication.  He states it is made a dramatic difference in how he is doing.  For the record it should be noted that this patient is not having any depression mania schizophrenia or anxiety or substance abuse disorders that are causing his symptoms  He does test positive for multiple symptoms he relates that he fails to give close attention to details and he often makes careless errors at work.  He does have difficulty sustaining attention when doing tasks his mind races any thinks about other things.  He states his bosses have told him at times it does not seem like he is listening closely.  He does not always follow through any often gets scattered not finishing his work.  He has had difficult time organizing tasks.  At times he misplaces things but that does not happen a lot.  He is easily distracted by external stimuli.  And he is also forgetful in daily activities.  In addition to this he states at times he fidgets and he feels like he has a internal motor running in addition to this he talks out of turn and interrupts.  He relates that these issues were present in elementary school he states he often got in trouble in elementary school he often got paddling from the teachers and his parents.  He also states that he is struggling to do well in school because of his inattentiveness.  Back then there was not a label for it and he was not on medication.  He states that this has had significant impact on his function both at home and work.  The patient's BMI is calculated.  The patient does have obesity.  The patient does try to some degree staying active and watching diet.  It  is in the vital signs and acknowledged.  It is above the recommended BMI for the patient's height and weight.  The patient has been counseled regarding healthy diet, restricted portions, avoiding excessive carbohydrates/sugary foods, and increase physical activity as health permits.  It is in the patient's best interest to lower the risk of secondary illness including heart disease strokes and cancer by losing weight.  The patient acknowledges this information.    Review of Systems  Constitutional: Negative for activity change.  HENT: Negative for congestion and rhinorrhea.   Respiratory: Negative for cough and shortness of breath.   Cardiovascular: Negative for chest pain.  Gastrointestinal: Negative for abdominal pain, diarrhea, nausea and vomiting.  Genitourinary: Negative for dysuria and hematuria.  Neurological: Negative for weakness and headaches.  Psychiatric/Behavioral: Negative for behavioral problems and confusion.       Objective:   Physical Exam Constitutional:      General: He is not in acute distress.    Appearance: He is well-developed.  HENT:     Head: Normocephalic.  Cardiovascular:     Rate and Rhythm: Normal rate and regular rhythm.     Heart sounds: Normal heart sounds. No murmur.  Pulmonary:     Effort: Pulmonary effort is normal.     Breath sounds: Normal breath sounds.  Skin:    General: Skin is warm  and dry.  Neurological:     Mental Status: He is alert.  Psychiatric:        Behavior: Behavior normal.           Assessment & Plan:  Adult ADD Does well on medication Continue medication No adjustments necessary at this point Please see documentation above regarding evaluation and diagnosis  Morbid obesity patient was encouraged to watch diet exercise try to lose weight to get under 300 pounds  Prediabetes minimize starches minimize sugars exercise on a regular basis  Taking medication on a regular basis for her lipids  Lab work later this  year  25 minutes was spent with the patient.  This statement verifies that 25 minutes was indeed spent with the patient.  More than 50% of this visit-total duration of the visit-was spent in counseling and coordination of care. The issues that the patient came in for today as reflected in the diagnosis (s) please refer to documentation for further details.

## 2018-08-17 ENCOUNTER — Ambulatory Visit: Payer: PRIVATE HEALTH INSURANCE | Admitting: Family Medicine

## 2018-09-06 ENCOUNTER — Telehealth: Payer: Self-pay

## 2018-09-06 NOTE — Telephone Encounter (Signed)
I called left message to r/c.  Patient Amphet/Dextr  Mg Er is approved. I called to let pt know to have it filled.

## 2018-09-06 NOTE — Telephone Encounter (Signed)
Patient is aware of approval and will go to the pharm to pick up.

## 2018-10-07 ENCOUNTER — Other Ambulatory Visit: Payer: Self-pay | Admitting: Family Medicine

## 2018-10-27 ENCOUNTER — Other Ambulatory Visit: Payer: Self-pay | Admitting: Family Medicine

## 2018-11-17 ENCOUNTER — Ambulatory Visit: Payer: PRIVATE HEALTH INSURANCE | Admitting: Family Medicine

## 2018-12-08 ENCOUNTER — Other Ambulatory Visit: Payer: Self-pay

## 2018-12-08 ENCOUNTER — Ambulatory Visit (INDEPENDENT_AMBULATORY_CARE_PROVIDER_SITE_OTHER): Payer: PRIVATE HEALTH INSURANCE | Admitting: Family Medicine

## 2018-12-08 ENCOUNTER — Encounter: Payer: Self-pay | Admitting: Family Medicine

## 2018-12-08 VITALS — Wt 300.0 lb

## 2018-12-08 DIAGNOSIS — F909 Attention-deficit hyperactivity disorder, unspecified type: Secondary | ICD-10-CM | POA: Diagnosis not present

## 2018-12-08 DIAGNOSIS — R7303 Prediabetes: Secondary | ICD-10-CM | POA: Diagnosis not present

## 2018-12-08 DIAGNOSIS — E7849 Other hyperlipidemia: Secondary | ICD-10-CM

## 2018-12-08 MED ORDER — AMPHETAMINE-DEXTROAMPHET ER 10 MG PO CP24: 10.0000 mg | ORAL_CAPSULE | Freq: Every day | ORAL | 0 refills | Status: DC

## 2018-12-08 MED ORDER — LISINOPRIL 5 MG PO TABS: ORAL_TABLET | ORAL | 5 refills | Status: DC

## 2018-12-08 MED ORDER — AMPHETAMINE-DEXTROAMPHET ER 10 MG PO CP24: ORAL_CAPSULE | ORAL | 0 refills | Status: DC

## 2018-12-08 NOTE — Progress Notes (Signed)
   Subjective:    Patient ID: Phillip Spencer, male    DOB: 1961/03/22, 58 y.o.   MRN: [id number]  HPI Patient was seen today for ADD checkup.  This patient does have ADD.  Patient takes medications for this.  If this does help control overall symptoms.  Please see below. -weight, vital signs reviewed.  The following items were covered. -Compliance with medication : Adderall 10 mg daily  -Problems with completing homework, paying attention/taking good notes in school: none  -grades: n/a  - Eating patterns : eats well  -sleeping: sleeps well Does well taken his ADD medicine needs some refills He relates ADD medicine does help him function  He does struggle with morbid obesity he is trying to best he can try to be healthy with his eating.  Cholesterol he does take his medicine regular basis you need to do lab work coming fall time  History of prediabetes tries watch starches try stays physically active  -Additional issues or questions: none  Virtual Visit via Video Note  I connected with Phillip Spencer on 12/08/18 at 10:30 AM EDT by a video enabled telemedicine application and verified that I am speaking with the correct person using two identifiers.  Location: Patient: home Provider: office   I discussed the limitations of evaluation and management by telemedicine and the availability of in person appointments. The patient expressed understanding and agreed to proceed.  History of Present Illness:    Observations/Objective:   Assessment and Plan:   Follow Up Instructions:    I discussed the assessment and treatment plan with the patient. The patient was provided an opportunity to ask questions and all were answered. The patient agreed with the plan and demonstrated an understanding of the instructions.   The patient was advised to call back or seek an in-person evaluation if the symptoms worsen or if the condition fails to improve as anticipated.  I  provided 15 minutes of non-face-to-face time during this encounter.   Marlowe [place]ores, LPN   Review of Systems  Constitutional: Negative for activity change, appetite change and fatigue.  HENT: Negative for congestion and rhinorrhea.   Respiratory: Negative for cough and shortness of breath.   Cardiovascular: Negative for chest pain and leg swelling.  Gastrointestinal: Negative for abdominal pain, nausea and vomiting.  Neurological: Negative for dizziness and headaches.  Psychiatric/Behavioral: Negative for agitation and behavioral problems.       Objective:   Physical Exam  Patient had virtual visit Appears to be in no distress Atraumatic Neuro able to relate and oriented No apparent resp distress Color normal       Assessment & Plan:  Adult ADD 3 prescription sent in He will call us for the fourth prescription Follow-up 4 months  Lab work later this fall  Hyperlipidemia take medicine watch diet Morbid obesity watch diet exercise try to lose weight Prediabetes keep cholesterol sugars under good control to lessen cardiovascular risk

## 2019-01-19 ENCOUNTER — Other Ambulatory Visit: Payer: Self-pay

## 2019-01-19 ENCOUNTER — Encounter: Payer: Self-pay | Admitting: Emergency Medicine

## 2019-01-19 ENCOUNTER — Emergency Department: Payer: PRIVATE HEALTH INSURANCE

## 2019-01-19 ENCOUNTER — Emergency Department
Admission: EM | Admit: 2019-01-19 | Discharge: 2019-01-19 | Disposition: A | Discharge status: Home / Self Care | Payer: PRIVATE HEALTH INSURANCE | Source: Home / Self Care | Attending: Student in an Organized Health Care Education/Training Program | Admitting: Student in an Organized Health Care Education/Training Program

## 2019-01-19 DIAGNOSIS — A09 Infectious gastroenteritis and colitis, unspecified: Secondary | ICD-10-CM

## 2019-01-19 DIAGNOSIS — A020 Salmonella enteritis: Secondary | ICD-10-CM | POA: Diagnosis not present

## 2019-01-19 DIAGNOSIS — A029 Salmonella infection, unspecified: Secondary | ICD-10-CM

## 2019-01-19 DIAGNOSIS — Z87891 Personal history of nicotine dependence: Secondary | ICD-10-CM | POA: Insufficient documentation

## 2019-01-19 DIAGNOSIS — R1032 Left lower quadrant pain: Secondary | ICD-10-CM | POA: Insufficient documentation

## 2019-01-19 DIAGNOSIS — R7303 Prediabetes: Secondary | ICD-10-CM | POA: Insufficient documentation

## 2019-01-19 DIAGNOSIS — R509 Fever, unspecified: Secondary | ICD-10-CM | POA: Insufficient documentation

## 2019-01-19 DIAGNOSIS — R197 Diarrhea, unspecified: Secondary | ICD-10-CM | POA: Insufficient documentation

## 2019-01-19 DIAGNOSIS — Z7982 Long term (current) use of aspirin: Secondary | ICD-10-CM | POA: Insufficient documentation

## 2019-01-19 DIAGNOSIS — R110 Nausea: Secondary | ICD-10-CM | POA: Insufficient documentation

## 2019-01-19 DIAGNOSIS — Z79899 Other long term (current) drug therapy: Secondary | ICD-10-CM | POA: Insufficient documentation

## 2019-01-19 DIAGNOSIS — I10 Essential (primary) hypertension: Secondary | ICD-10-CM | POA: Insufficient documentation

## 2019-01-19 DIAGNOSIS — Z20828 Contact with and (suspected) exposure to other viral communicable diseases: Secondary | ICD-10-CM | POA: Insufficient documentation

## 2019-01-19 HISTORY — DX: Essential (primary) hypertension: I10

## 2019-01-19 LAB — GASTROINTESTINAL PANEL BY PCR, STOOL (REPLACES STOOL CULTURE)

## 2019-01-19 LAB — CBC
HCT: 50.1 % (ref 39.0–52.0)
Hemoglobin: 17.3 g/dL — ABNORMAL HIGH (ref 13.0–17.0)
MCH: 32.5 pg (ref 26.0–34.0)
MCHC: 34.5 g/dL (ref 30.0–36.0)
MCV: 94 fL (ref 80.0–100.0)
Platelets: 173 10*3/uL (ref 150–400)
RBC: 5.33 MIL/uL (ref 4.22–5.81)
RDW: 12.7 % (ref 11.5–15.5)
WBC: 8.8 10*3/uL (ref 4.0–10.5)
nRBC: 0 % (ref 0.0–0.2)

## 2019-01-19 LAB — LIPASE, BLOOD: Lipase: 30 U/L (ref 11–51)

## 2019-01-19 LAB — C DIFFICILE QUICK SCREEN W PCR REFLEX
C Diff antigen: NEGATIVE
C Diff interpretation: NOT DETECTED
C Diff toxin: NEGATIVE

## 2019-01-19 LAB — COMPREHENSIVE METABOLIC PANEL
ALT: 70 U/L — ABNORMAL HIGH (ref 0–44)
AST: 42 U/L — ABNORMAL HIGH (ref 15–41)
Albumin: 4.1 g/dL (ref 3.5–5.0)
Alkaline Phosphatase: 97 U/L (ref 38–126)
Anion gap: 13 (ref 5–15)
BUN: 16 mg/dL (ref 6–20)
CO2: 20 mmol/L — ABNORMAL LOW (ref 22–32)
Calcium: 9.2 mg/dL (ref 8.9–10.3)
Chloride: 101 mmol/L (ref 98–111)
Creatinine, Ser: 1.13 mg/dL (ref 0.61–1.24)
GFR calc Af Amer: >60 mL/min (ref >60)
GFR calc non Af Amer: >60 mL/min (ref >60)
Glucose, Bld: 164 mg/dL — ABNORMAL HIGH (ref 70–99)
Potassium: 3.4 mmol/L — ABNORMAL LOW (ref 3.5–5.1)
Sodium: 134 mmol/L — ABNORMAL LOW (ref 135–145)
Total Bilirubin: 1.4 mg/dL — ABNORMAL HIGH (ref 0.3–1.2)
Total Protein: 8.2 g/dL — ABNORMAL HIGH (ref 6.5–8.1)

## 2019-01-19 LAB — SARS CORONAVIRUS 2 BY RT PCR (HOSPITAL ORDER, PERFORMED IN ~~LOC~~ HOSPITAL LAB): SARS Coronavirus 2: NEGATIVE

## 2019-01-19 MED ORDER — CIPROFLOXACIN HCL 500 MG PO TABS: 500.0000 mg | ORAL_TABLET | Freq: Two times a day (BID) | ORAL | 0 refills | Status: AC

## 2019-01-19 MED ORDER — SODIUM CHLORIDE 0.9 % IV BOLUS
500.0000 mL | Freq: Once | INTRAVENOUS | Status: AC
  Administered 2019-01-19: 500 mL via INTRAVENOUS

## 2019-01-19 MED ORDER — SODIUM CHLORIDE 0.9% FLUSH
3.0000 mL | Freq: Once | INTRAVENOUS | Status: AC
  Administered 2019-01-19: 3 mL via INTRAVENOUS

## 2019-01-19 MED ORDER — ONDANSETRON HCL 4 MG PO TABS: 4.0000 mg | ORAL_TABLET | Freq: Every day | ORAL | 0 refills | Status: AC | PRN

## 2019-01-19 MED ORDER — CIPROFLOXACIN HCL 500 MG PO TABS
500.0000 mg | ORAL_TABLET | Freq: Once | ORAL | Status: AC
  Administered 2019-01-19: 500 mg via ORAL
  Filled 2019-01-19: qty 1

## 2019-01-19 MED ORDER — IOHEXOL 300 MG/ML  SOLN
100.0000 mL | Freq: Once | INTRAMUSCULAR | Status: AC | PRN
  Administered 2019-01-19: 100 mL via INTRAVENOUS

## 2019-01-19 NOTE — ED Provider Notes (Signed)
[facility] Emergency Department Provider Note    First MD Initiated Contact with Patient 01/19/19 1106     (approximate)  I have reviewed the triage vital signs and the nursing notes.   HISTORY  Chief Complaint Abdominal Pain    HPI Phillip Spencer is a 58 y.o. male listed past medical history presents the ER for multiple episodes of diarrhea as well as left lower quadrant abdominal pain fevers and chills.  States the pain is mild to moderate.  Is worried that he got some food poisoning.  Denies any blood in his diarrhea.  No hematemesis.  Has had some nausea.  Denies any chest pain or shortness of breath.    Past Medical History:  Diagnosis Date  . GERD (gastroesophageal reflux disease)   . Hematuria   . Hyperlipidemia   . Hypertension   . Pre-diabetes   . Renal calculus, right    No family history on file. Past Surgical History:  Procedure Laterality Date  . CHOLECYSTECTOMY  2011  . CYSTOSCOPY WITH RETROGRADE PYELOGRAM, URETEROSCOPY AND STENT PLACEMENT Right 09/06/2014   Procedure: CYSTOSCOPY WITH RETROGRADE PYELOGRAM, RIGHT DIAGNOSTIC URETEROSCOPY AND STENT PLACEMENT;  Surgeon: Sebastian Acheheodore Manny, MD;  [facility];  Service: Urology;  Laterality: Right;  . CYSTOSCOPY WITH RETROGRADE PYELOGRAM, URETEROSCOPY AND STENT PLACEMENT Right 09/27/2014   Procedure: CYSTOSCOPY WITH RETROGRADE PYELOGRAM, RIGHT URETEROSCOPY AND STENT EXCHANGE, LASER LITHOTRIPSY WITH STONE BASKETRY;  Surgeon: Sebastian Acheheodore Manny, MD;  [facility];  Service: Urology;  Laterality: Right;  . FOOT SURGERY Bilateral   . HERNIA REPAIR     ventral x 2  . HOLMIUM LASER APPLICATION Right 09/27/2014   Procedure: HOLMIUM LASER APPLICATION;  Surgeon: Sebastian Acheheodore Manny, MD;  [facility] [place];  Service: Urology;  Laterality: Right;  . KNEE ARTHROSCOPY Right   . NASAL SINUS SURGERY     Patient Active Problem List   Diagnosis  Date Noted  . Attention deficit hyperactivity disorder (ADHD) 12/11/2017  . GERD (gastroesophageal reflux disease) 05/16/2015  . Hypertriglyceridemia 11/25/2013  . Morbid obesity (HCC) 11/25/2013  . Hyperlipidemia 02/01/2013  . Prediabetes 02/01/2013      Prior to Admission medications   Medication Sig Start Date End Date Taking? Authorizing Provider  amphetamine-dextroamphetamine (ADDERALL XR) 10 MG 24 hr capsule Take 1 capsule (10 mg total) by mouth daily. 12/08/18  Yes Babs SciaraLuking, Scott A, MD  aspirin 81 MG tablet Take 81 mg by mouth daily.   Yes [provider]  atorvastatin (LIPITOR) 40 MG tablet TAKE ONE (1) TABLET BY MOUTH ONCE DAILY 10/27/18  Yes Babs SciaraLuking, Scott A, MD  cholestyramine Lanetta Inch(QUESTRAN) 4 GM/DOSE powder Take by mouth at bedtime.   Yes [provider]  esomeprazole (NEXIUM) 40 MG capsule Take 40 mg by mouth daily before breakfast.   Yes [provider]  fluticasone (FLONASE) 50 MCG/ACT nasal spray Place 2 sprays into both nostrils daily. 01/22/18  Yes Domenick GongMortenson, Ashley, MD  lisinopril (ZESTRIL) 5 MG tablet TAKE (1) TABLET BY MOUTH EVERY DAY 12/08/18  Yes Babs SciaraLuking, Scott A, MD  Nutritional Supplements (JUICE PLUS FIBRE PO) Take 3 capsules by mouth at bedtime. 1- Veggies, 1- Fruit, 1-Berry   Yes [provider]  Omega-3 Fatty Acids (FISH OIL) 1200 MG CAPS Take 1 capsule by mouth daily.    Yes [provider]  vitamin B-12 (CYANOCOBALAMIN) 1000 MCG tablet Take 1,000 mcg by mouth daily.   Yes [provider]  amphetamine-dextroamphetamine (ADDERALL  XR) 10 MG 24 hr capsule Take 1 capsule (10 mg total) by mouth daily. Patient not taking: Reported on 01/19/2019 12/08/18   Babs SciaraLuking, Scott A, MD  amphetamine-dextroamphetamine (ADDERALL XR) 10 MG 24 hr capsule TAKE (1) CAPSULE BY MOUTH EVERY DAY Patient not taking: Reported on 01/19/2019 12/08/18   Babs SciaraLuking, Scott A, MD  ciprofloxacin (CIPRO) 500 MG tablet Take 1 tablet (500 mg total) by mouth 2 (two)  times daily for 7 days. 01/19/19 01/26/19  Willy Eddyobinson, Shelbi Vaccaro, MD  ibuprofen (ADVIL,MOTRIN) 600 MG tablet Take 1 tablet (600 mg total) by mouth every 6 (six) hours as needed. Patient not taking: Reported on 01/19/2019 01/22/18   Domenick GongMortenson, Ashley, MD  ondansetron (ZOFRAN) 4 MG tablet Take 1 tablet (4 mg total) by mouth daily as needed. 01/19/19 01/19/20  Willy Eddyobinson, Ahtziry Saathoff, MD    Allergies Amoxicillin    Social History Social History   Tobacco Use  . Smoking status: Former Smoker    Quit date: 08/31/1984    Years since quitting: 34.4  . Smokeless tobacco: Former NeurosurgeonUser    Quit date: 02/05/1991  . Tobacco comment: only smoked for one year. quit in 1985. quit smokeless tobacco in  1992  Substance Use Topics  . Alcohol use: Yes    Alcohol/week: 4.0 standard drinks    Types: 4 Cans of beer per week  . Drug use: Not on file    Review of Systems Patient denies headaches, rhinorrhea, blurry vision, numbness, shortness of breath, chest pain, edema, cough, abdominal pain, nausea, vomiting, diarrhea, dysuria, fevers, rashes or hallucinations unless otherwise stated above in HPI. ____________________________________________   PHYSICAL EXAM:  VITAL SIGNS: Vitals:   01/19/19 1046 01/19/19 1209  BP: (!) 143/94 (!) 135/99  Pulse: (!) 105 85  Resp: (!) 24 16  Temp: 99 F (37.2 C)   SpO2: 95% 95%    Constitutional: Alert and oriented.  Eyes: Conjunctivae are normal.  Head: Atraumatic. Nose: No congestion/rhinnorhea. Mouth/Throat: Mucous membranes are moist.   Neck: No stridor. Painless ROM.  Cardiovascular: Normal rate, regular rhythm. Grossly normal heart sounds.  Good peripheral circulation. Respiratory: Normal respiratory effort.  No retractions. Lungs CTAB. Gastrointestinal: Soft and nontender with mild ttp in llq No distention. No abdominal bruits. No CVA tenderness. Genitourinary:  Musculoskeletal: No lower extremity tenderness nor edema.  No joint effusions. Neurologic:  Normal speech  and language. No gross focal neurologic deficits are appreciated. No facial droop Skin:  Skin is warm, dry and intact. No rash noted. Psychiatric: Mood and affect are normal. Speech and behavior are normal.  ____________________________________________   LABS (all labs ordered are listed, but only abnormal results are displayed)  Results for orders placed or performed during the hospital encounter of 01/19/19 (from the past 24 hour(s))  Lipase, blood     Status: None   Collection Time: 01/19/19 11:02 AM  Result Value Ref Range   Lipase 30 11 - 51 U/L  Comprehensive metabolic panel     Status: Abnormal   Collection Time: 01/19/19 11:02 AM  Result Value Ref Range   Sodium 134 (L) 135 - 145 mmol/L   Potassium 3.4 (L) 3.5 - 5.1 mmol/L   Chloride 101 98 - 111 mmol/L   CO2 20 (L) 22 - 32 mmol/L   Glucose, Bld 164 (H) 70 - 99 mg/dL   BUN 16 6 - 20 mg/dL   Creatinine, Ser 5.781.13 0.61 - 1.24 mg/dL   Calcium 9.2 8.9 - 46.910.3 mg/dL   Total Protein 8.2 (H) 6.5 -  8.1 g/dL   Albumin 4.1 3.5 - 5.0 g/dL   AST 42 (H) 15 - 41 U/L   ALT 70 (H) 0 - 44 U/L   Alkaline Phosphatase 97 38 - 126 U/L   Total Bilirubin 1.4 (H) 0.3 - 1.2 mg/dL   GFR calc non Af Amer >60 >60 mL/min   GFR calc Af Amer >60 >60 mL/min   Anion gap 13 5 - 15  CBC     Status: Abnormal   Collection Time: 01/19/19 11:02 AM  Result Value Ref Range   WBC 8.8 4.0 - 10.5 K/uL   RBC 5.33 4.22 - 5.81 MIL/uL   Hemoglobin 17.3 (H) 13.0 - 17.0 g/dL   HCT 50.1 39.0 - 52.0 %   MCV 94.0 80.0 - 100.0 fL   MCH 32.5 26.0 - 34.0 pg   MCHC 34.5 30.0 - 36.0 g/dL   RDW 12.7 11.5 - 15.5 %   Platelets 173 150 - 400 K/uL   nRBC 0.0 0.0 - 0.2 %  Gastrointestinal Panel by PCR , Stool     Status: Abnormal   Collection Time: 01/19/19 11:55 AM   Specimen: Stool  Result Value Ref Range   Campylobacter species NOT DETECTED NOT DETECTED   Plesimonas shigelloides NOT DETECTED NOT DETECTED   Salmonella species DETECTED (A) NOT DETECTED   Yersinia  enterocolitica NOT DETECTED NOT DETECTED   Vibrio species NOT DETECTED NOT DETECTED   Vibrio cholerae NOT DETECTED NOT DETECTED   Enteroaggregative E coli (EAEC) NOT DETECTED NOT DETECTED   Enteropathogenic E coli (EPEC) NOT DETECTED NOT DETECTED   Enterotoxigenic E coli (ETEC) NOT DETECTED NOT DETECTED   Shiga like toxin producing E coli (STEC) NOT DETECTED NOT DETECTED   Shigella/Enteroinvasive E coli (EIEC) NOT DETECTED NOT DETECTED   Cryptosporidium NOT DETECTED NOT DETECTED   Cyclospora cayetanensis NOT DETECTED NOT DETECTED   Entamoeba histolytica NOT DETECTED NOT DETECTED   Giardia lamblia NOT DETECTED NOT DETECTED   Adenovirus F40/41 NOT DETECTED NOT DETECTED   Astrovirus NOT DETECTED NOT DETECTED   Norovirus GI/GII NOT DETECTED NOT DETECTED   Rotavirus A NOT DETECTED NOT DETECTED   Sapovirus (I, II, IV, and V) NOT DETECTED NOT DETECTED  C difficile quick scan w PCR reflex     Status: None   Collection Time: 01/19/19 11:55 AM   Specimen: Stool  Result Value Ref Range   C Diff antigen NEGATIVE NEGATIVE   C Diff toxin NEGATIVE NEGATIVE   C Diff interpretation No C. difficile detected.   SARS Coronavirus 2 (CEPHEID- Performed in [place] hospital lab), Hosp Order     Status: None   Collection Time: 01/19/19 11:55 AM   Specimen: Nasopharyngeal  Result Value Ref Range   SARS Coronavirus 2 NEGATIVE NEGATIVE   ____________________________________________  EKG My review and personal interpretation at Time: 11:01   Indication: abd pain  Rate: 1001  Rhythm: sinus Axis: normal Other: normal intervals, no stemi ____________________________________________  RADIOLOGY  I personally reviewed all radiographic images ordered to evaluate for the above acute complaints and reviewed radiology reports and findings.  These findings were personally discussed with the patient.  Please see medical record for radiology report.  ____________________________________________   PROCEDURES   Procedure(s) performed:  Procedures    Critical Care performed: no ____________________________________________   INITIAL IMPRESSION / ASSESSMENT AND PLAN / ED COURSE  Pertinent labs & imaging results that were available during my care of the patient were reviewed by me and considered in  my medical decision making (see chart for details).   DDX: Reticulitis, colitis, infectious diarrhea, dysentery, dehydration, electrolyte abnormality  Phillip StainCharles Scott Wellman is a 58 y.o. who presents to the ED with symptoms as described above.  Given the patient's pain and symptoms CT imaging was ordered to exclude diverticulitis perforation or abscess.  Blood work is fairly reassuring with mild dehydration for which she was given IV fluids.  CT imaging just shows enteritis and diarrheal illness.  Stool studies showed no evidence of C. difficile but does show Salmonella for which we will treat with Cipro as he has been 10 episodes of diarrhea daily.  He is tolerating oral hydration and otherwise appears well therefore I do believe he stable and appropriate for outpatient follow-up.     The patient was evaluated in Emergency Department today for the symptoms described in the history of present illness. He/she was evaluated in the context of the global COVID-19 pandemic, which necessitated consideration that the patient might be at risk for infection with the SARS-CoV-2 virus that causes COVID-19. Institutional protocols and algorithms that pertain to the evaluation of patients at risk for COVID-19 are in a state of rapid change based on information released by regulatory bodies including the CDC and federal and state organizations. These policies and algorithms were followed during the patient's care in the ED.  As part of my medical decision making, I reviewed the following data within the electronic MEDICAL RECORD NUMBER Nursing notes reviewed and incorporated, Labs reviewed, notes from prior ED visits and [place]  Controlled Substance Database   ____________________________________________   FINAL CLINICAL IMPRESSION(S) / ED DIAGNOSES  Final diagnoses:  Diarrhea of infectious origin  Salmonella      NEW MEDICATIONS STARTED DURING THIS VISIT:  New Prescriptions   CIPROFLOXACIN (CIPRO) 500 MG TABLET    Take 1 tablet (500 mg total) by mouth 2 (two) times daily for 7 days.   ONDANSETRON (ZOFRAN) 4 MG TABLET    Take 1 tablet (4 mg total) by mouth daily as needed.     Note:  This document was prepared using Dragon voice recognition software and may include unintentional dictation errors.    Willy Eddyobinson, Rheya Minogue, MD 01/19/19 [phone]

## 2019-01-19 NOTE — ED Triage Notes (Signed)
C/O fever, chills, diarrhea.  States symptoms started Monday at around 1000.  Tylenol and Ibuprofen given at [facility].  Patient was tested for flu and COVID 19 pta.

## 2019-01-19 NOTE — ED Triage Notes (Signed)
[facility] sent from [facility] with c/o abd pain with diarrhea for the past 2 days. Denies vomiting.

## 2019-01-19 NOTE — ED Notes (Signed)
Pt reporting last dose of tylenol was 1 hour ago. Pt denies having taken imodium or other medication prior to arrival. Pt diaphoretic at this time and reporting intermittent chills and hot sweats since Monday.

## 2019-01-19 NOTE — ED Notes (Signed)
Patient transported to CT 

## 2019-01-21 ENCOUNTER — Encounter: Payer: Self-pay | Admitting: Internal Medicine

## 2019-01-21 ENCOUNTER — Other Ambulatory Visit: Payer: Self-pay

## 2019-01-21 ENCOUNTER — Inpatient Hospital Stay
Admission: EM | Admit: 2019-01-21 | Discharge: 2019-01-23 | DRG: 372 | Disposition: A | Discharge status: Home / Self Care | Payer: PRIVATE HEALTH INSURANCE | Attending: Internal Medicine | Admitting: Internal Medicine

## 2019-01-21 DIAGNOSIS — N179 Acute kidney failure, unspecified: Secondary | ICD-10-CM | POA: Diagnosis present

## 2019-01-21 DIAGNOSIS — R197 Diarrhea, unspecified: Secondary | ICD-10-CM | POA: Diagnosis present

## 2019-01-21 DIAGNOSIS — E876 Hypokalemia: Secondary | ICD-10-CM | POA: Diagnosis present

## 2019-01-21 DIAGNOSIS — Z7951 Long term (current) use of inhaled steroids: Secondary | ICD-10-CM

## 2019-01-21 DIAGNOSIS — Z1159 Encounter for screening for other viral diseases: Secondary | ICD-10-CM | POA: Diagnosis not present

## 2019-01-21 DIAGNOSIS — Z9049 Acquired absence of other specified parts of digestive tract: Secondary | ICD-10-CM | POA: Diagnosis not present

## 2019-01-21 DIAGNOSIS — A020 Salmonella enteritis: Principal | ICD-10-CM | POA: Diagnosis present

## 2019-01-21 DIAGNOSIS — R7303 Prediabetes: Secondary | ICD-10-CM | POA: Diagnosis present

## 2019-01-21 DIAGNOSIS — Z6841 Body Mass Index (BMI) 40.0 and over, adult: Secondary | ICD-10-CM | POA: Diagnosis not present

## 2019-01-21 DIAGNOSIS — N400 Enlarged prostate without lower urinary tract symptoms: Secondary | ICD-10-CM | POA: Diagnosis present

## 2019-01-21 DIAGNOSIS — E785 Hyperlipidemia, unspecified: Secondary | ICD-10-CM | POA: Diagnosis present

## 2019-01-21 DIAGNOSIS — E860 Dehydration: Secondary | ICD-10-CM | POA: Diagnosis present

## 2019-01-21 DIAGNOSIS — A029 Salmonella infection, unspecified: Secondary | ICD-10-CM

## 2019-01-21 DIAGNOSIS — Z87891 Personal history of nicotine dependence: Secondary | ICD-10-CM

## 2019-01-21 DIAGNOSIS — Z79899 Other long term (current) drug therapy: Secondary | ICD-10-CM

## 2019-01-21 DIAGNOSIS — K219 Gastro-esophageal reflux disease without esophagitis: Secondary | ICD-10-CM | POA: Diagnosis present

## 2019-01-21 DIAGNOSIS — Z7982 Long term (current) use of aspirin: Secondary | ICD-10-CM

## 2019-01-21 DIAGNOSIS — I10 Essential (primary) hypertension: Secondary | ICD-10-CM | POA: Diagnosis present

## 2019-01-21 LAB — URINALYSIS, COMPLETE (UACMP) WITH MICROSCOPIC
Bacteria, UA: NONE SEEN
Bilirubin Urine: NEGATIVE
Glucose, UA: NEGATIVE mg/dL
Ketones, ur: 5 mg/dL — AB
Leukocytes,Ua: NEGATIVE
Nitrite: NEGATIVE
Protein, ur: 30 mg/dL — AB
Specific Gravity, Urine: 1.017 (ref 1.005–1.030)
pH: 5 (ref 5.0–8.0)

## 2019-01-21 LAB — CREATININE, SERUM
Creatinine, Ser: 1.22 mg/dL (ref 0.61–1.24)
GFR calc Af Amer: >60 mL/min (ref >60)
GFR calc non Af Amer: >60 mL/min (ref >60)

## 2019-01-21 LAB — COMPREHENSIVE METABOLIC PANEL
ALT: 73 U/L — ABNORMAL HIGH (ref 0–44)
AST: 74 U/L — ABNORMAL HIGH (ref 15–41)
Albumin: 3.8 g/dL (ref 3.5–5.0)
Alkaline Phosphatase: 92 U/L (ref 38–126)
Anion gap: 15 (ref 5–15)
BUN: 25 mg/dL — ABNORMAL HIGH (ref 6–20)
CO2: 20 mmol/L — ABNORMAL LOW (ref 22–32)
Calcium: 9.3 mg/dL (ref 8.9–10.3)
Chloride: 98 mmol/L (ref 98–111)
Creatinine, Ser: 1.46 mg/dL — ABNORMAL HIGH (ref 0.61–1.24)
GFR calc Af Amer: >60 mL/min (ref >60)
GFR calc non Af Amer: 53 mL/min — ABNORMAL LOW (ref >60)
Glucose, Bld: 158 mg/dL — ABNORMAL HIGH (ref 70–99)
Potassium: 2.8 mmol/L — ABNORMAL LOW (ref 3.5–5.1)
Sodium: 133 mmol/L — ABNORMAL LOW (ref 135–145)
Total Bilirubin: 1.5 mg/dL — ABNORMAL HIGH (ref 0.3–1.2)
Total Protein: 8.1 g/dL (ref 6.5–8.1)

## 2019-01-21 LAB — CBC
HCT: 50.9 % (ref 39.0–52.0)
Hemoglobin: 17.8 g/dL — ABNORMAL HIGH (ref 13.0–17.0)
MCH: 32.4 pg (ref 26.0–34.0)
MCHC: 35 g/dL (ref 30.0–36.0)
MCV: 92.5 fL (ref 80.0–100.0)
Platelets: 159 10*3/uL (ref 150–400)
RBC: 5.5 MIL/uL (ref 4.22–5.81)
RDW: 12.3 % (ref 11.5–15.5)
WBC: 8.2 10*3/uL (ref 4.0–10.5)
nRBC: 0 % (ref 0.0–0.2)

## 2019-01-21 LAB — LIPASE, BLOOD: Lipase: 33 U/L (ref 11–51)

## 2019-01-21 LAB — GLUCOSE, CAPILLARY: Glucose-Capillary: 152 mg/dL — ABNORMAL HIGH (ref 70–99)

## 2019-01-21 LAB — MAGNESIUM: Magnesium: 2 mg/dL (ref 1.7–2.4)

## 2019-01-21 MED ORDER — ACETAMINOPHEN 650 MG RE SUPP: 650.0000 mg | Freq: Four times a day (QID) | RECTAL | Status: DC | PRN

## 2019-01-21 MED ORDER — PANTOPRAZOLE SODIUM 40 MG PO TBEC
40.0000 mg | DELAYED_RELEASE_TABLET | Freq: Every day | ORAL | Status: DC
  Administered 2019-01-21 – 2019-01-23 (×3): 40 mg via ORAL
  Filled 2019-01-21 (×3): qty 1

## 2019-01-21 MED ORDER — ENOXAPARIN SODIUM 40 MG/0.4ML ~~LOC~~ SOLN
40.0000 mg | SUBCUTANEOUS | Status: DC
  Administered 2019-01-21 – 2019-01-22 (×2): 40 mg via SUBCUTANEOUS
  Filled 2019-01-21 (×2): qty 0.4

## 2019-01-21 MED ORDER — IBUPROFEN 400 MG PO TABS
600.0000 mg | ORAL_TABLET | Freq: Four times a day (QID) | ORAL | Status: DC | PRN
  Administered 2019-01-22: 02:00:00 600 mg via ORAL
  Filled 2019-01-21: qty 2

## 2019-01-21 MED ORDER — FINASTERIDE 5 MG PO TABS
5.0000 mg | ORAL_TABLET | Freq: Every day | ORAL | Status: DC
  Administered 2019-01-22 – 2019-01-23 (×2): 5 mg via ORAL
  Filled 2019-01-21 (×2): qty 1

## 2019-01-21 MED ORDER — POTASSIUM CHLORIDE CRYS ER 20 MEQ PO TBCR
40.0000 meq | EXTENDED_RELEASE_TABLET | Freq: Once | ORAL | Status: AC
  Administered 2019-01-21: 40 meq via ORAL
  Filled 2019-01-21: qty 2

## 2019-01-21 MED ORDER — FLUTICASONE PROPIONATE 50 MCG/ACT NA SUSP
2.0000 | Freq: Every day | NASAL | Status: DC
  Administered 2019-01-23: 2 via NASAL
  Filled 2019-01-21: qty 16

## 2019-01-21 MED ORDER — POTASSIUM CHLORIDE IN NACL 40-0.9 MEQ/L-% IV SOLN
INTRAVENOUS | Status: DC
  Administered 2019-01-21 – 2019-01-23 (×4): 100 mL/h via INTRAVENOUS
  Filled 2019-01-21 (×5): qty 1000

## 2019-01-21 MED ORDER — ATORVASTATIN CALCIUM 20 MG PO TABS
40.0000 mg | ORAL_TABLET | Freq: Every day | ORAL | Status: DC
  Administered 2019-01-21 – 2019-01-22 (×2): 40 mg via ORAL
  Filled 2019-01-21 (×2): qty 2

## 2019-01-21 MED ORDER — LISINOPRIL 5 MG PO TABS
5.0000 mg | ORAL_TABLET | Freq: Every day | ORAL | Status: DC
  Administered 2019-01-22 – 2019-01-23 (×2): 5 mg via ORAL
  Filled 2019-01-21 (×2): qty 1

## 2019-01-21 MED ORDER — AMPHETAMINE-DEXTROAMPHET ER 5 MG PO CP24
10.0000 mg | ORAL_CAPSULE | Freq: Every day | ORAL | Status: DC
  Administered 2019-01-22 – 2019-01-23 (×2): 10 mg via ORAL
  Filled 2019-01-21 (×2): qty 2

## 2019-01-21 MED ORDER — OXYCODONE HCL 5 MG PO TABS: 5.0000 mg | ORAL_TABLET | ORAL | Status: DC | PRN

## 2019-01-21 MED ORDER — ACETAMINOPHEN 325 MG PO TABS: 650.0000 mg | ORAL_TABLET | Freq: Four times a day (QID) | ORAL | Status: DC | PRN

## 2019-01-21 MED ORDER — LOPERAMIDE HCL 2 MG PO CAPS
4.0000 mg | ORAL_CAPSULE | Freq: Three times a day (TID) | ORAL | Status: DC | PRN
  Administered 2019-01-21 – 2019-01-22 (×3): 4 mg via ORAL
  Filled 2019-01-21 (×3): qty 2

## 2019-01-21 MED ORDER — CIPROFLOXACIN IN D5W 400 MG/200ML IV SOLN
400.0000 mg | Freq: Two times a day (BID) | INTRAVENOUS | Status: DC
  Administered 2019-01-21 – 2019-01-23 (×4): 400 mg via INTRAVENOUS
  Filled 2019-01-21 (×4): qty 200

## 2019-01-21 MED ORDER — SODIUM CHLORIDE 0.9 % IV SOLN
1000.0000 mL | Freq: Once | INTRAVENOUS | Status: AC
  Administered 2019-01-21: 1000 mL via INTRAVENOUS

## 2019-01-21 MED ORDER — LORATADINE 10 MG PO TABS
10.0000 mg | ORAL_TABLET | Freq: Every day | ORAL | Status: DC
  Administered 2019-01-22 – 2019-01-23 (×2): 10 mg via ORAL
  Filled 2019-01-21 (×2): qty 1

## 2019-01-21 MED ORDER — OCUVITE-LUTEIN PO CAPS
1.0000 | ORAL_CAPSULE | Freq: Every day | ORAL | Status: DC
  Administered 2019-01-21 – 2019-01-23 (×3): 1 via ORAL
  Filled 2019-01-21 (×4): qty 1

## 2019-01-21 NOTE — ED Notes (Signed)
ED TO INPATIENT HANDOFF REPORT  ED Nurse Name and Phone #: Benay Pillow [phone]  S Name/Age/Gender Providence Lanius 58 y.o. male Room/Bed: ED09A/ED09A  Code Status   Code Status: Not on file  Home/SNF/Other Home Patient oriented to: self, place, time and situation Is this baseline? Yes   Triage Complete: Triage complete  Chief Complaint Abd pain,diarrhea   Triage Note FIRST NURSE NOTE-dx with salmonella. Reports diarrhea not getting better. ambulatory in. Placed in wheelchair.  Pt seen here on Wed, dx with salmonella. Reports increased abd pain with severe diarrhea, sharp pain in center of chest.    Allergies Allergies  Allergen Reactions  . Amoxicillin Nausea And Vomiting, Nausea Only and Other (See Comments)    Other Reaction: GI Upset    Level of Care/Admitting Diagnosis ED Disposition    ED Disposition Condition [facility]: [place] [[id number]]  Level of Care: Med-Surg [16]  Covid Evaluation: Asymptomatic Screening Protocol (No Symptoms)  Diagnosis: Salmonella gastroenteritis [003.0.ICD-9-CM]  Admitting Physician: Loletha Grayer [[id number]]  Attending Physician: Loletha Grayer [[id number]]  Estimated length of stay: past midnight tomorrow  Certification:: I certify this patient will need inpatient services for at least 2 midnights  PT Class (Do Not Modify): Inpatient [101]  PT Acc Code (Do Not Modify): Private [1]       B Medical/Surgery History Past Medical History:  Diagnosis Date  . GERD (gastroesophageal reflux disease)   . Hematuria   . Hyperlipidemia   . Hypertension   . Pre-diabetes   . Renal calculus, right    Past Surgical History:  Procedure Laterality Date  . CHOLECYSTECTOMY  2011  . CYSTOSCOPY WITH RETROGRADE PYELOGRAM, URETEROSCOPY AND STENT PLACEMENT Right 09/06/2014   Procedure: CYSTOSCOPY WITH RETROGRADE PYELOGRAM, RIGHT DIAGNOSTIC URETEROSCOPY AND STENT PLACEMENT;  Surgeon: Alexis Frock,  MD;  Location: [facility];  Service: Urology;  Laterality: Right;  . CYSTOSCOPY WITH RETROGRADE PYELOGRAM, URETEROSCOPY AND STENT PLACEMENT Right 09/27/2014   Procedure: CYSTOSCOPY WITH RETROGRADE PYELOGRAM, RIGHT URETEROSCOPY AND STENT EXCHANGE, LASER LITHOTRIPSY WITH STONE BASKETRY;  Surgeon: Alexis Frock, MD;  [facility];  Service: Urology;  Laterality: Right;  . FOOT SURGERY Bilateral   . HERNIA REPAIR     ventral x 2  . HOLMIUM LASER APPLICATION Right 8/85/0277   Procedure: HOLMIUM LASER APPLICATION;  Surgeon: Alexis Frock, MD;  Location: [facility];  Service: Urology;  Laterality: Right;  . KNEE ARTHROSCOPY Right   . NASAL SINUS SURGERY       A IV Location/Drains/Wounds Patient Lines/Drains/Airways Status   Active Line/Drains/Airways    Name:   Placement date:   Placement time:   Site:   Days:   Peripheral IV 01/21/19 Right Hand   01/21/19    1104    Hand   less than 1   Ureteral Drain/Stent Right ureter 5 Fr.   09/27/14    0914    Right ureter   1577   Incision (Closed) 09/06/14 Penis   09/06/14    0839     1598   Incision (Closed) 09/27/14 Penis   09/27/14    0846     1577          Intake/Output Last 24 hours No intake or output data in the 24 hours ending 01/21/19 1331  Labs/Imaging Results for orders placed or performed during the hospital encounter of 01/21/19 (from the past 48 hour(s))  Glucose, capillary  Status: Abnormal   Collection Time: 01/21/19 10:41 AM  Result Value Ref Range   Glucose-Capillary 152 (H) 70 - 99 mg/dL  Lipase, blood     Status: None   Collection Time: 01/21/19 11:03 AM  Result Value Ref Range   Lipase 33 11 - 51 U/L    Comment: Performed at [facility], [address] [place], [place] [address]  Comprehensive metabolic panel     Status: Abnormal   Collection Time: 01/21/19 11:03 AM  Result Value Ref Range   Sodium 133 (L) 135 - 145 mmol/L   Potassium 2.8 (L) 3.5 -  5.1 mmol/L   Chloride 98 98 - 111 mmol/L   CO2 20 (L) 22 - 32 mmol/L   Glucose, Bld 158 (H) 70 - 99 mg/dL   BUN 25 (H) 6 - 20 mg/dL   Creatinine, Ser 6.041.46 (H) 0.61 - 1.24 mg/dL   Calcium 9.3 8.9 - 54.010.3 mg/dL   Total Protein 8.1 6.5 - 8.1 g/dL   Albumin 3.8 3.5 - 5.0 g/dL   AST 74 (H) 15 - 41 U/L   ALT 73 (H) 0 - 44 U/L   Alkaline Phosphatase 92 38 - 126 U/L   Total Bilirubin 1.5 (H) 0.3 - 1.2 mg/dL   GFR calc non Af Amer 53 (L) >60 mL/min   GFR calc Af Amer >60 >60 mL/min   Anion gap 15 5 - 15    Comment: Performed at [facility], [address] [place], [place] [address]  CBC     Status: Abnormal   Collection Time: 01/21/19 11:03 AM  Result Value Ref Range   WBC 8.2 4.0 - 10.5 K/uL   RBC 5.50 4.22 - 5.81 MIL/uL   Hemoglobin 17.8 (H) 13.0 - 17.0 g/dL   HCT 14.750.9 82.939.0 - 56.252.0 %   MCV 92.5 80.0 - 100.0 fL   MCH 32.4 26.0 - 34.0 pg   MCHC 35.0 30.0 - 36.0 g/dL   RDW 13.012.3 86.511.5 - 78.415.5 %   Platelets 159 150 - 400 K/uL   nRBC 0.0 0.0 - 0.2 %    Comment: Performed at [facility], [address] [place], [place] [address]   Ct Abdomen Pelvis W Contrast  Result Date: 01/19/2019 CLINICAL DATA:  Abdominal pain, generalized, fever, chills, diarrhea, history of hernia repair and cholecystectomy EXAM: CT ABDOMEN AND PELVIS WITH CONTRAST TECHNIQUE: Multidetector CT imaging of the abdomen and pelvis was performed using the standard protocol following bolus administration of intravenous contrast. CONTRAST:  100mL OMNIPAQUE IOHEXOL 300 MG/ML  SOLN COMPARISON:  09/14/2014 FINDINGS: Lower chest: No acute abnormality. Hepatobiliary: Hepatic steatosis. Status post cholecystectomy. No biliary dilatation. Pancreas: Unremarkable. No pancreatic ductal dilatation or surrounding inflammatory changes. Spleen: Normal in size without significant abnormality. Adrenals/Urinary Tract: Adrenal glands are unremarkable. Kidneys are normal, without renal calculi, solid lesion, or hydronephrosis.  Bladder is unremarkable. Stomach/Bowel: Stomach is within normal limits. Appendix appears normal. No evidence of bowel wall thickening, distention, or inflammatory changes. The colon is fluid-filled sigmoid diverticulosis. Vascular/Lymphatic: Aortic atherosclerosis. No enlarged abdominal or pelvic lymph nodes. Reproductive: No mass or other significant abnormality. Other: Postoperative findings of prior ventral hernia repair. No abdominopelvic ascites. Musculoskeletal: No acute or significant osseous findings. IMPRESSION: 1. No definite acute CT findings of the abdomen or pelvis to explain pain. 2.  The colon is fluid-filled, in keeping with diarrheal illness. 3.  Hepatic steatosis 4. Chronic, incidental, and postoperative findings as detailed above. Electronically Signed   By:  Lauralyn PrimesAlex  Bibbey M.D.   On: 01/19/2019 14:46    Pending Labs Unresulted Labs (From admission, onward)    Start     Ordered   01/21/19 1321  HIV antibody (Routine Testing)  Add-on,   AD     01/21/19 1320   01/21/19 1320  Magnesium  Add-on,   AD     01/21/19 1319   01/21/19 1320  Novel Coronavirus, NAA (hospital order; send-out to ref lab)  (Novel Coronavirus, NAA [facility](Hospital Order))  Once,   STAT    Question Answer Comment  Current symptoms Other (testing not indicated)   Patient immune status Normal      01/21/19 1320   01/21/19 1030  Urinalysis, Complete w Microscopic  ONCE - STAT,   STAT     01/21/19 1029          Vitals/Pain Today's Vitals   01/21/19 1130 01/21/19 1200 01/21/19 1230 01/21/19 1300  BP: 130/89 108/69 127/69 (!) 141/85  Pulse: 73 75 79 72  Resp: 14 15 20 14   Temp:      TempSrc:      SpO2: 96% 96% 95% 94%  Weight:      Height:      PainSc:        Isolation Precautions Airborne and Contact precautions  Medications Medications  0.9 % NaCl with KCl 40 mEq / L  infusion (has no administration in time range)  0.9 %  sodium chloride infusion (0 mLs Intravenous Stopped 01/21/19 1309)     Mobility walks Low fall risk   Focused Assessments GI Pt diagnosed with Salmonella on Wednesday. Pt has been having greater than 10 episodes of diarrhea per day. Pt had syncopal episode while in triage and triage nurse reported seizure like activity. None observed since pt has been in treatment room.   R Recommendations: See Admitting Provider Note  Report given to:   Additional Notes:

## 2019-01-21 NOTE — Progress Notes (Signed)
Pharmacy Antibiotic Note  Phillip Spencer is a 58 y.o. male admitted on 01/21/2019 with Salmonella gastroenteritis .  Pharmacy has been consulted for cipro dosing. Pt has been taking Cipro PO as outpt, last dose this AM.    Plan: Cipro 400 mg IV q12h  Height: 6' (182.9 cm) Weight: 300 lb (136.1 kg) IBW/kg (Calculated) : 77.6  Temp (24hrs), Avg:97.8 F (36.6 C), Min:97.8 F (36.6 C), Max:97.8 F (36.6 C)  Recent Labs  Lab 01/19/19 1102 01/21/19 1103  WBC 8.8 8.2  CREATININE 1.13 1.46*    Estimated Creatinine Clearance: 79.7 mL/min (A) (by C-G formula based on SCr of 1.46 mg/dL (H)).    Allergies  Allergen Reactions  . Amoxicillin Nausea And Vomiting, Nausea Only and Other (See Comments)    Other Reaction: GI Upset    Antimicrobials this admission: Cipro IV 7/10 >> Per PTA med list says PO Cipro started 7/8  Dose adjustments this admission:   Microbiology results: 7/10 BCx: ordered   Thank you for allowing pharmacy to be a part of this patient's care.  Rocky Morel 01/21/2019 2:39 PM

## 2019-01-21 NOTE — ED Notes (Signed)
Pt COVID swabbed on Wednesday. Per Dr. Corky Downs, repeat not needed.

## 2019-01-21 NOTE — ED Triage Notes (Signed)
Pt seen here on Wed, dx with salmonella. Reports increased abd pain with severe diarrhea, sharp pain in center of chest.

## 2019-01-21 NOTE — ED Provider Notes (Signed)
[facility] Emergency Department Provider Note   ____________________________________________    I have reviewed the triage vital signs and the nursing notes.   HISTORY  Chief Complaint Abdominal Pain     HPI Phillip Spencer is a 58 y.o. male who presents with complaints of abdominal cramping, and severe diarrhea.  Patient was seen here 2 days ago diagnosed with Salmonella on GI panel, has been taking Cipro as instructed but reports diarrhea seems to be worsening and abdominal cramping is worse as well.  Denies fevers does feel lightheaded dizzy and weak.   Past Medical History:  Diagnosis Date  . GERD (gastroesophageal reflux disease)   . Hematuria   . Hyperlipidemia   . Hypertension   . Pre-diabetes   . Renal calculus, right     Patient Active Problem List   Diagnosis Date Noted  . Salmonella gastroenteritis 01/21/2019  . Attention deficit hyperactivity disorder (ADHD) 12/11/2017  . GERD (gastroesophageal reflux disease) 05/16/2015  . Hypertriglyceridemia 11/25/2013  . Morbid obesity ([place]) 11/25/2013  . Hyperlipidemia 02/01/2013  . Prediabetes 02/01/2013    Past Surgical History:  Procedure Laterality Date  . CHOLECYSTECTOMY  2011  . CYSTOSCOPY WITH RETROGRADE PYELOGRAM, URETEROSCOPY AND STENT PLACEMENT Right 09/06/2014   Procedure: CYSTOSCOPY WITH RETROGRADE PYELOGRAM, RIGHT DIAGNOSTIC URETEROSCOPY AND STENT PLACEMENT;  Surgeon: Alexis Frock, MD;  Location: [facility];  Service: Urology;  Laterality: Right;  . CYSTOSCOPY WITH RETROGRADE PYELOGRAM, URETEROSCOPY AND STENT PLACEMENT Right 09/27/2014   Procedure: CYSTOSCOPY WITH RETROGRADE PYELOGRAM, RIGHT URETEROSCOPY AND STENT EXCHANGE, LASER LITHOTRIPSY WITH STONE BASKETRY;  Surgeon: Alexis Frock, MD;  Location: [facility];  Service: Urology;  Laterality: Right;  . FOOT SURGERY Bilateral   . HERNIA REPAIR     ventral x 2  . HOLMIUM LASER  APPLICATION Right 1/66/0630   Procedure: HOLMIUM LASER APPLICATION;  Surgeon: Alexis Frock, MD;  Location: [facility];  Service: Urology;  Laterality: Right;  . KNEE ARTHROSCOPY Right   . NASAL SINUS SURGERY      Prior to Admission medications   Medication Sig Start Date End Date Taking? Authorizing Provider  amphetamine-dextroamphetamine (ADDERALL XR) 10 MG 24 hr capsule Take 1 capsule (10 mg total) by mouth daily. 12/08/18  Yes Luking, Elayne Snare, MD  atorvastatin (LIPITOR) 40 MG tablet TAKE ONE (1) TABLET BY MOUTH ONCE DAILY Patient taking differently: Take 40 mg by mouth daily.  10/27/18  Yes Kathyrn Drown, MD  ciprofloxacin (CIPRO) 500 MG tablet Take 1 tablet (500 mg total) by mouth 2 (two) times daily for 7 days. 01/19/19 01/26/19 Yes Merlyn Lot, MD  fluticasone [facility]) 50 MCG/ACT nasal spray Place 2 sprays into both nostrils daily. 01/22/18  Yes Melynda Ripple, MD  lisinopril (ZESTRIL) 5 MG tablet TAKE (1) TABLET BY MOUTH EVERY DAY Patient taking differently: Take 5 mg by mouth daily.  12/08/18  Yes Luking, Elayne Snare, MD  ondansetron (ZOFRAN) 4 MG tablet Take 1 tablet (4 mg total) by mouth daily as needed. 01/19/19 01/19/20 Yes Merlyn Lot, MD  amphetamine-dextroamphetamine (ADDERALL XR) 10 MG 24 hr capsule Take 1 capsule (10 mg total) by mouth daily. Patient not taking: Reported on 01/19/2019 12/08/18   Kathyrn Drown, MD  amphetamine-dextroamphetamine (ADDERALL XR) 10 MG 24 hr capsule TAKE (1) CAPSULE BY MOUTH EVERY DAY Patient not taking: Reported on 01/19/2019 12/08/18   Kathyrn Drown, MD  aspirin 81 MG tablet Take 81 mg by mouth daily.  [provider]  cholestyramine Lanetta Inch(QUESTRAN) 4 GM/DOSE powder Take by mouth at bedtime.    [provider]  ibuprofen (ADVIL,MOTRIN) 600 MG tablet Take 1 tablet (600 mg total) by mouth every 6 (six) hours as needed. Patient not taking: Reported on 01/19/2019 01/22/18   Domenick GongMortenson, Ashley, MD  Nutritional Supplements  (JUICE PLUS FIBRE PO) Take 3 capsules by mouth at bedtime. 1- Veggies, 1- Fruit, 1-Berry    [provider]  Omega-3 Fatty Acids (FISH OIL) 1200 MG CAPS Take 1 capsule by mouth daily.     [provider]  vitamin B-12 (CYANOCOBALAMIN) 1000 MCG tablet Take 1,000 mcg by mouth daily.    [provider]     Allergies Amoxicillin  No family history on file.  Social History Social History   Tobacco Use  . Smoking status: Former Smoker    Quit date: 08/31/1984    Years since quitting: 34.4  . Smokeless tobacco: Former NeurosurgeonUser    Quit date: 02/05/1991  . Tobacco comment: only smoked for one year. quit in 1985. quit smokeless tobacco in  1992  Substance Use Topics  . Alcohol use: Yes    Alcohol/week: 4.0 standard drinks    Types: 4 Cans of beer per week  . Drug use: Not on file    Review of Systems  Constitutional: No fever/chills Eyes: No visual changes.  ENT: No sore throat. Cardiovascular: Denies chest pain. Respiratory: Denies shortness of breath. Gastrointestinal: As above Genitourinary: Negative for dysuria. Musculoskeletal: Negative for back pain. Skin: Negative for rash. Neurological: Negative for headaches or weakness   ____________________________________________   PHYSICAL EXAM:  VITAL SIGNS: ED Triage Vitals  Enc Vitals Group     BP 01/21/19 1028 111/75     Pulse Rate 01/21/19 1028 97     Resp 01/21/19 1028 16     Temp 01/21/19 1028 97.8 F (36.6 C)     Temp Source 01/21/19 1028 Oral     SpO2 01/21/19 1028 96 %     Weight 01/21/19 1029 136.1 kg (300 lb)     Height 01/21/19 1029 1.829 m (6')     Head Circumference --      Peak Flow --      Pain Score 01/21/19 1028 8     Pain Loc --      Pain Edu? --      Excl. in GC? --     Constitutional: Alert and oriented.  Nose: No congestion/rhinnorhea. Mouth/Throat: Mucous membranes are dry  Cardiovascular: Normal rate, regular rhythm. Grossly normal heart sounds.  Good peripheral  circulation. Respiratory: Normal respiratory effort.  No retractions. Lungs CTAB. Gastrointestinal: Soft and nontender. No distention.  No CVA tenderness.  Musculoskeletal: No lower extremity tenderness nor edema.  Warm and well perfused Neurologic:  Normal speech and language. No gross focal neurologic deficits are appreciated.  Skin:  Skin is warm, dry and intact. No rash noted. Psychiatric: Mood and affect are normal. Speech and behavior are normal.  ____________________________________________   LABS (all labs ordered are listed, but only abnormal results are displayed)  Labs Reviewed  COMPREHENSIVE METABOLIC PANEL - Abnormal; Notable for the following components:      Result Value   Sodium 133 (*)    Potassium 2.8 (*)    CO2 20 (*)    Glucose, Bld 158 (*)    BUN 25 (*)    Creatinine, Ser 1.46 (*)    AST 74 (*)    ALT 73 (*)  Total Bilirubin 1.5 (*)    GFR calc non Af Amer 53 (*)    All other components within normal limits  CBC - Abnormal; Notable for the following components:   Hemoglobin 17.8 (*)    All other components within normal limits  GLUCOSE, CAPILLARY - Abnormal; Notable for the following components:   Glucose-Capillary 152 (*)    All other components within normal limits  LIPASE, BLOOD  URINALYSIS, COMPLETE (UACMP) WITH MICROSCOPIC  MAGNESIUM   ____________________________________________  EKG   ____________________________________________  RADIOLOGY  None ____________________________________________   PROCEDURES  Procedure(s) performed: No  Procedures   Critical Care performed: No ____________________________________________   INITIAL IMPRESSION / ASSESSMENT AND PLAN / ED COURSE  Pertinent labs & imaging results that were available during my care of the patient were reviewed by me and considered in my medical decision making (see chart for details).  Patient presents with diarrhea, near syncopal episodes, known Salmonella has  failed outpatient treatment, appears dehydrated, lab work confirms, continue IV fluids and admit to the hospitalist service    ____________________________________________   FINAL CLINICAL IMPRESSION(S) / ED DIAGNOSES  Final diagnoses:  Salmonella        Note:  This document was prepared using Dragon voice recognition software and may include unintentional dictation errors.   Jene EveryKinner, Jlen Wintle, MD 01/21/19 1322

## 2019-01-21 NOTE — H&P (Signed)
[facility] [place] NAME: Kanoa Phillippi    MR#:  [id number]  DATE OF BIRTH:  12/13/1960  DATE OF ADMISSION:  01/21/2019  PRIMARY CARE PHYSICIAN: Kathyrn Drown, MD   REQUESTING/REFERRING PHYSICIAN: Dr. Lavonia Drafts  CHIEF COMPLAINT:   Chief Complaint  Patient presents with  . Abdominal Pain    HISTORY OF PRESENT ILLNESS:  Shrihaan Porzio  is a 58 y.o. male with a known history of recently diagnosed Salmonella gastroenteritis.  He has been having diarrhea since Monday 15-20 bowel movements a day.  He came into the ER couple days ago and was diagnosed with Salmonella and was started on Cipro.  He states that he has not gotten any better and still having 15-20 bowel movements a day.  No blood in the bowel movements. Nauseous and some abdominal bloating and soreness but no vomiting.  No blood in the bowel movements.  He passed out today when coming in and feeling very weak.  He has been having a little low-grade fever at home.  Hospitalist services were contacted for further evaluation.  PAST MEDICAL HISTORY:   Past Medical History:  Diagnosis Date  . GERD (gastroesophageal reflux disease)   . Hematuria   . Hyperlipidemia   . Hypertension   . Pre-diabetes   . Renal calculus, right     PAST SURGICAL HISTORY:   Past Surgical History:  Procedure Laterality Date  . CHOLECYSTECTOMY  2011  . CYSTOSCOPY WITH RETROGRADE PYELOGRAM, URETEROSCOPY AND STENT PLACEMENT Right 09/06/2014   Procedure: CYSTOSCOPY WITH RETROGRADE PYELOGRAM, RIGHT DIAGNOSTIC URETEROSCOPY AND STENT PLACEMENT;  Surgeon: Alexis Frock, MD;  Location: [facility];  Service: Urology;  Laterality: Right;  . CYSTOSCOPY WITH RETROGRADE PYELOGRAM, URETEROSCOPY AND STENT PLACEMENT Right 09/27/2014   Procedure: CYSTOSCOPY WITH RETROGRADE PYELOGRAM, RIGHT URETEROSCOPY AND STENT EXCHANGE, LASER LITHOTRIPSY WITH STONE BASKETRY;  Surgeon: Alexis Frock, MD;   Location: [facility];  Service: Urology;  Laterality: Right;  . FOOT SURGERY Bilateral   . HERNIA REPAIR     ventral x 2  . HOLMIUM LASER APPLICATION Right 0/17/7939   Procedure: HOLMIUM LASER APPLICATION;  Surgeon: Alexis Frock, MD;  Location: [facility];  Service: Urology;  Laterality: Right;  . KNEE ARTHROSCOPY Right   . NASAL SINUS SURGERY      SOCIAL HISTORY:   Social History   Tobacco Use  . Smoking status: Former Smoker    Quit date: 08/31/1984    Years since quitting: 34.4  . Smokeless tobacco: Former Systems developer    Quit date: 02/05/1991  . Tobacco comment: only smoked for one year. quit in 1985. quit smokeless tobacco in  1992  Substance Use Topics  . Alcohol use: Yes    Alcohol/week: 4.0 standard drinks    Types: 4 Cans of beer per week    FAMILY HISTORY:   Family History  Problem Relation Age of Onset  . Breast cancer Mother   . CAD Father   . Pulmonary fibrosis Father     DRUG ALLERGIES:   Allergies  Allergen Reactions  . Amoxicillin Nausea And Vomiting, Nausea Only and Other (See Comments)    Other Reaction: GI Upset    REVIEW OF SYSTEMS:  CONSTITUTIONAL: Some fever, no chills or sweats.  Positive for fatigue  EYES: No blurred or double vision.  EARS, NOSE, AND THROAT: No tinnitus or ear pain. No sore throat RESPIRATORY: No cough, shortness of breath, wheezing or hemoptysis.  CARDIOVASCULAR: No chest pain, orthopnea, edema.  GASTROINTESTINAL: Positive for nausea, diarrhea and abdominal pain. No blood in bowel movements.  GENITOURINARY: No dysuria, hematuria.  ENDOCRINE: No polyuria, nocturia,  HEMATOLOGY: No anemia, easy bruising or bleeding SKIN: No rash or lesion. MUSCULOSKELETAL: No joint pain or arthritis.   NEUROLOGIC: Passed out today PSYCHIATRY: No anxiety or depression.   MEDICATIONS AT HOME:   Prior to Admission medications   Medication Sig Start Date End Date Taking? Authorizing Provider   amphetamine-dextroamphetamine (ADDERALL XR) 10 MG 24 hr capsule Take 1 capsule (10 mg total) by mouth daily. 12/08/18  Yes Luking, Jonna CoupScott A, MD  atorvastatin (LIPITOR) 40 MG tablet TAKE ONE (1) TABLET BY MOUTH ONCE DAILY Patient taking differently: Take 40 mg by mouth daily.  10/27/18  Yes Luking, Jonna CoupScott A, MD  cholestyramine light (PREVALITE) 4 GM/DOSE powder Take 4 g by mouth at bedtime.    Yes [provider]  ciprofloxacin (CIPRO) 500 MG tablet Take 1 tablet (500 mg total) by mouth 2 (two) times daily for 7 days. 01/19/19 01/26/19 Yes Willy Eddyobinson, Patrick, MD  Coenzyme Q10 (CO Q-10) 200 MG CAPS Take 200 mg by mouth daily.    Yes [provider]  fexofenadine (ALLEGRA) 180 MG tablet Take 180 mg by mouth daily.   Yes [provider]  finasteride (PROSCAR) 5 MG tablet Take 5 mg by mouth daily.   Yes [provider]  fluticasone (FLONASE) 50 MCG/ACT nasal spray Place 2 sprays into both nostrils daily. 01/22/18  Yes Domenick GongMortenson, Ashley, MD  ibuprofen (ADVIL) 200 MG tablet Take 400-800 mg by mouth every 6 (six) hours as needed for fever, mild pain or moderate pain.   Yes [provider]  lisinopril (ZESTRIL) 5 MG tablet TAKE (1) TABLET BY MOUTH EVERY DAY Patient taking differently: Take 5 mg by mouth daily.  12/08/18  Yes Luking, Jonna CoupScott A, MD  loperamide (IMODIUM) 2 MG capsule Take 2-4 mg by mouth as directed.   Yes [provider]  Multiple Vitamins-Minerals (PRESERVISION AREDS) CAPS Take 1-2 capsules by mouth daily.   Yes [provider]  Nutritional Supplements (JUICE PLUS FIBRE PO) Take 3 capsules by mouth at bedtime. 1- Veggies, 1- Fruit, 1-Berry   Yes [provider]  Omega-3 Fatty Acids (FISH OIL) 1200 MG CAPS Take 1 capsule by mouth daily.    Yes [provider]  omeprazole (PRILOSEC) 20 MG capsule Take 20 mg by mouth 2 (two) times a day.   Yes [provider]  ondansetron (ZOFRAN) 4 MG tablet Take 1 tablet (4 mg  total) by mouth daily as needed. 01/19/19 01/19/20 Yes Willy Eddyobinson, Patrick, MD  vitamin B-12 (CYANOCOBALAMIN) 1000 MCG tablet Take 1,000 mcg by mouth daily.   Yes [provider]      VITAL SIGNS:  Blood pressure (!) 141/85, pulse 72, temperature 97.8 F (36.6 C), temperature source Oral, resp. rate 14, height 6' (1.829 m), weight 136.1 kg, SpO2 94 %.  PHYSICAL EXAMINATION:  GENERAL:  58 y.o.-year-old patient lying in the bed with no acute distress.  EYES: Pupils equal, round, reactive to light and accommodation. No scleral icterus. Extraocular muscles intact.  HEENT: Head atraumatic, normocephalic. Oropharynx and nasopharynx clear.  NECK:  Supple, no jugular venous distention. No thyroid enlargement, no tenderness.  LUNGS: Normal breath sounds bilaterally, no wheezing, rales,rhonchi or crepitation. No use of accessory muscles of respiration.  CARDIOVASCULAR: S1, S2 normal. No murmurs, rubs, or gallops.  ABDOMEN: Soft, positive for abdominal tenderness, nondistended. Bowel  sounds present. No organomegaly or mass.  EXTREMITIES: No pedal edema, cyanosis, or clubbing.  NEUROLOGIC: Cranial nerves II through XII are intact. Muscle strength 5/5 in all extremities. Sensation intact. Gait not checked.  PSYCHIATRIC: The patient is alert and oriented x 3.  SKIN: No rash, lesion, or ulcer.   LABORATORY PANEL:   CBC Recent Labs  Lab 01/21/19 1103  WBC 8.2  HGB 17.8*  HCT 50.9  PLT 159   ------------------------------------------------------------------------------------------------------------------  Chemistries  Recent Labs  Lab 01/21/19 1103  NA 133*  K 2.8*  CL 98  CO2 20*  GLUCOSE 158*  BUN 25*  CREATININE 1.46*  CALCIUM 9.3  AST 74*  ALT 73*  ALKPHOS 92  BILITOT 1.5*   ------------------------------------------------------------------------------------------------------------------    RADIOLOGY:  Ct Abdomen Pelvis W Contrast  Result Date: 01/19/2019 CLINICAL  DATA:  Abdominal pain, generalized, fever, chills, diarrhea, history of hernia repair and cholecystectomy EXAM: CT ABDOMEN AND PELVIS WITH CONTRAST TECHNIQUE: Multidetector CT imaging of the abdomen and pelvis was performed using the standard protocol following bolus administration of intravenous contrast. CONTRAST:  100mL OMNIPAQUE IOHEXOL 300 MG/ML  SOLN COMPARISON:  09/14/2014 FINDINGS: Lower chest: No acute abnormality. Hepatobiliary: Hepatic steatosis. Status post cholecystectomy. No biliary dilatation. Pancreas: Unremarkable. No pancreatic ductal dilatation or surrounding inflammatory changes. Spleen: Normal in size without significant abnormality. Adrenals/Urinary Tract: Adrenal glands are unremarkable. Kidneys are normal, without renal calculi, solid lesion, or hydronephrosis. Bladder is unremarkable. Stomach/Bowel: Stomach is within normal limits. Appendix appears normal. No evidence of bowel wall thickening, distention, or inflammatory changes. The colon is fluid-filled sigmoid diverticulosis. Vascular/Lymphatic: Aortic atherosclerosis. No enlarged abdominal or pelvic lymph nodes. Reproductive: No mass or other significant abnormality. Other: Postoperative findings of prior ventral hernia repair. No abdominopelvic ascites. Musculoskeletal: No acute or significant osseous findings. IMPRESSION: 1. No definite acute CT findings of the abdomen or pelvis to explain pain. 2.  The colon is fluid-filled, in keeping with diarrheal illness. 3.  Hepatic steatosis 4. Chronic, incidental, and postoperative findings as detailed above. Electronically Signed   By: Lauralyn PrimesAlex  Bibbey M.D.   On: 01/19/2019 14:46    EKG:   Sinus tachycardia 101 bpm  IMPRESSION AND PLAN:   1.  Salmonella gastroenteritis with fever and severe diarrhea.  Get blood cultures x2.  IV Cipro.  PRN Imodium.  PRN pain medications and antipyretics. 2.  Severe hypo-kalemia.  Give IV fluids with potassium and oral potassium.  Check a magnesium and  replace if low. 3.  Hypertension continue usual medications 4.  Hyperlipidemia unspecified on Lipitor 5.  BPH on Proscar 6.  Morbid obesity with a BMI of 40.69.  Weight loss needed 7.  Advised to cut back on alcohol.  Does not get shaky if he does not drink.  All the records are reviewed and case discussed with ED provider. Management plans discussed with the patient, and he is in agreement.  CODE STATUS: Full code  TOTAL TIME TAKING CARE OF THIS PATIENT: 50 minutes.    Alford Highlandichard Zakaree Mcclenahan M.D on 01/21/2019 at 1:43 PM  Between 7am to 6pm - Pager - [phone]  After 6pm call admission pager [phone]  [facility] Office  [phone]  CC: Primary care physician; Babs SciaraLuking, Scott A, MD

## 2019-01-21 NOTE — ED Triage Notes (Signed)
FIRST NURSE NOTE-dx with salmonella. Reports diarrhea not getting better. ambulatory in. Placed in wheelchair.

## 2019-01-22 LAB — CBC
HCT: 43.1 % (ref 39.0–52.0)
Hemoglobin: 14.8 g/dL (ref 13.0–17.0)
MCH: 32 pg (ref 26.0–34.0)
MCHC: 34.3 g/dL (ref 30.0–36.0)
MCV: 93.1 fL (ref 80.0–100.0)
Platelets: 166 10*3/uL (ref 150–400)
RBC: 4.63 MIL/uL (ref 4.22–5.81)
RDW: 12.4 % (ref 11.5–15.5)
WBC: 7.4 10*3/uL (ref 4.0–10.5)
nRBC: 0 % (ref 0.0–0.2)

## 2019-01-22 LAB — BASIC METABOLIC PANEL
Anion gap: 11 (ref 5–15)
BUN: 23 mg/dL — ABNORMAL HIGH (ref 6–20)
CO2: 25 mmol/L (ref 22–32)
Calcium: 8.8 mg/dL — ABNORMAL LOW (ref 8.9–10.3)
Chloride: 100 mmol/L (ref 98–111)
Creatinine, Ser: 1.11 mg/dL (ref 0.61–1.24)
GFR calc Af Amer: >60 mL/min (ref >60)
GFR calc non Af Amer: >60 mL/min (ref >60)
Glucose, Bld: 141 mg/dL — ABNORMAL HIGH (ref 70–99)
Potassium: 3.7 mmol/L (ref 3.5–5.1)
Sodium: 136 mmol/L (ref 135–145)

## 2019-01-22 LAB — NOVEL CORONAVIRUS, NAA (HOSP ORDER, SEND-OUT TO REF LAB; TAT 18-24 HRS): SARS-CoV-2, NAA: NOT DETECTED

## 2019-01-22 LAB — HIV ANTIBODY (ROUTINE TESTING W REFLEX): HIV Screen 4th Generation wRfx: NONREACTIVE

## 2019-01-22 NOTE — Plan of Care (Signed)

## 2019-01-22 NOTE — Progress Notes (Signed)
[facility] - [place] [facility]   PATIENT NAME: Phillip Spencer    MR#:  [id number]  DATE OF BIRTH:  26-Feb-1961  SUBJECTIVE:  CHIEF COMPLAINT:   Chief Complaint  Patient presents with  . Abdominal Pain   Still has diarrhea 6 times since last night REVIEW OF SYSTEMS:  Review of Systems  Constitutional: Negative for chills, fever and malaise/fatigue.  HENT: Negative for sore throat.   Eyes: Negative for blurred vision and double vision.  Respiratory: Negative for cough, hemoptysis, shortness of breath, wheezing and stridor.   Cardiovascular: Negative for chest pain, palpitations, orthopnea and leg swelling.  Gastrointestinal: Positive for diarrhea. Negative for abdominal pain, blood in stool, melena, nausea and vomiting.  Genitourinary: Negative for dysuria, flank pain and hematuria.  Musculoskeletal: Negative for back pain and joint pain.  Skin: Negative for rash.  Neurological: Negative for dizziness, sensory change, focal weakness, seizures, loss of consciousness, weakness and headaches.  Endo/Heme/Allergies: Negative for polydipsia.  Psychiatric/Behavioral: Negative for depression. The patient is not nervous/anxious.     DRUG ALLERGIES:   Allergies  Allergen Reactions  . Amoxicillin Nausea And Vomiting, Nausea Only and Other (See Comments)    Other Reaction: GI Upset   VITALS:  Blood pressure (!) 111/95, pulse 66, temperature 98.1 F (36.7 C), temperature source Oral, resp. rate 20, height 6' (1.829 m), weight 136.1 kg, SpO2 93 %. PHYSICAL EXAMINATION:  Physical Exam Constitutional:      General: He is not in acute distress.    Appearance: He is obese.  HENT:     Head: Normocephalic.     Mouth/Throat:     Mouth: Mucous membranes are moist.  Eyes:     General: No scleral icterus.    Conjunctiva/sclera: Conjunctivae normal.     Pupils: Pupils are equal, round, and reactive to light.  Neck:     Musculoskeletal: Normal range of motion and  neck supple.     Vascular: No JVD.     Trachea: No tracheal deviation.  Cardiovascular:     Rate and Rhythm: Normal rate and regular rhythm.     Heart sounds: Normal heart sounds. No murmur. No gallop.   Pulmonary:     Effort: Pulmonary effort is normal. No respiratory distress.     Breath sounds: Normal breath sounds. No wheezing or rales.  Abdominal:     General: Bowel sounds are normal. There is no distension.     Palpations: Abdomen is soft.     Tenderness: There is no abdominal tenderness. There is no rebound.  Musculoskeletal: Normal range of motion.        General: No tenderness.     Right lower leg: No edema.     Left lower leg: No edema.  Skin:    Findings: No erythema or rash.  Neurological:     Mental Status: He is alert and oriented to person, place, and time.     Cranial Nerves: No cranial nerve deficit.  Psychiatric:        Mood and Affect: Mood normal.    LABORATORY PANEL:  Male CBC Recent Labs  Lab 01/22/19 0455  WBC 7.4  HGB 14.8  HCT 43.1  PLT 166   ------------------------------------------------------------------------------------------------------------------ Chemistries  Recent Labs  Lab 01/21/19 1103  01/22/19 0455  NA 133*  --  136  K 2.8*  --  3.7  CL 98  --  100  CO2 20*  --  25  GLUCOSE 158*  --  141*  BUN 25*  --  23*  CREATININE 1.46*   < > 1.11  CALCIUM 9.3  --  8.8*  MG 2.0  --   --   AST 74*  --   --   ALT 73*  --   --   ALKPHOS 92  --   --   BILITOT 1.5*  --   --    < > = values in this interval not displayed.   RADIOLOGY:  No results found. ASSESSMENT AND PLAN:   1.  Salmonella gastroenteritis with fever and severe diarrhea.  Continue IV Cipro.  PRN Imodium.  PRN pain medications and antipyretics. 2.  Severe hypo-kalemia.    Improved with IV potassium.  Acute renal failure due to dehydration.  Improving with IV fluid support. 3.  Hypertension continue usual medications 4.  Hyperlipidemia unspecified on Lipitor 5.   BPH on Proscar 6.  Morbid obesity with a BMI of 40.69.  Weight loss needed 7.  Advised to cut back on alcohol.  Does not get shaky if he does not drink.  All the records are reviewed and case discussed with Care Management/Social Worker. Management plans discussed with the patient, family and they are in agreement.  CODE STATUS: Full Code  TOTAL TIME TAKING CARE OF THIS PATIENT: 27 minutes.   More than 50% of the time was spent in counseling/coordination of care: YES  POSSIBLE D/C IN 2 DAYS, DEPENDING ON CLINICAL CONDITION.   Demetrios Loll M.D on 01/22/2019 at 2:17 PM  Between 7am to 6pm - Pager - [phone]  After 6pm go to [email or web address] - [facility]

## 2019-01-23 LAB — BASIC METABOLIC PANEL
Anion gap: 7 (ref 5–15)
BUN: 14 mg/dL (ref 6–20)
CO2: 26 mmol/L (ref 22–32)
Calcium: 8.4 mg/dL — ABNORMAL LOW (ref 8.9–10.3)
Chloride: 104 mmol/L (ref 98–111)
Creatinine, Ser: 0.67 mg/dL (ref 0.61–1.24)
GFR calc Af Amer: >60 mL/min (ref >60)
GFR calc non Af Amer: >60 mL/min (ref >60)
Glucose, Bld: 128 mg/dL — ABNORMAL HIGH (ref 70–99)
Potassium: 3.9 mmol/L (ref 3.5–5.1)
Sodium: 137 mmol/L (ref 135–145)

## 2019-01-23 LAB — BILIRUBIN, TOTAL: Total Bilirubin: 1 mg/dL (ref 0.3–1.2)

## 2019-01-23 LAB — MAGNESIUM: Magnesium: 1.7 mg/dL (ref 1.7–2.4)

## 2019-01-23 MED ORDER — MAGNESIUM SULFATE 2 GM/50ML IV SOLN
2.0000 g | Freq: Once | INTRAVENOUS | Status: AC
  Administered 2019-01-23: 2 g via INTRAVENOUS
  Filled 2019-01-23: qty 50

## 2019-01-23 MED ORDER — CIPROFLOXACIN HCL 500 MG PO TABS
500.0000 mg | ORAL_TABLET | Freq: Two times a day (BID) | ORAL | Status: DC
  Administered 2019-01-23: 500 mg via ORAL
  Filled 2019-01-23: qty 1

## 2019-01-23 MED ORDER — LOPERAMIDE HCL 2 MG PO CAPS: 4.0000 mg | ORAL_CAPSULE | Freq: Three times a day (TID) | ORAL | 0 refills | Status: DC | PRN

## 2019-01-23 NOTE — Discharge Summary (Signed)
[facility] - [place] at [facility]   PATIENT NAME: Phillip Spencer    MR#:  [id number]  DATE OF BIRTH:  Jun 11, 1961  DATE OF ADMISSION:  01/21/2019   ADMITTING PHYSICIAN: Alford Highlandichard Wieting, MD  DATE OF DISCHARGE: 01/23/2019  PRIMARY CARE PHYSICIAN: Babs SciaraLuking, Scott A, MD   ADMISSION DIAGNOSIS:  Salmonella [A02.9] DISCHARGE DIAGNOSIS:  Active Problems:   Salmonella gastroenteritis  SECONDARY DIAGNOSIS:   Past Medical History:  Diagnosis Date  . GERD (gastroesophageal reflux disease)   . Hematuria   . Hyperlipidemia   . Hypertension   . Pre-diabetes   . Renal calculus, right    HOSPITAL COURSE:  1. Salmonella gastroenteritis with fever and severe diarrhea.  The patient has been treated with IV Cipro.PRN Imodium. PRN pain medications and antipyretics.  Change to p.o. Cipro. 2. Severe hypo-kalemia.   Improved with IV potassium.  Acute renal failure due to dehydration.  Improved with IV fluid support. 3. Hypertension continue usual medications 4. Hyperlipidemia unspecified on Lipitor 5. BPH on Proscar 6. Morbid obesity with a BMI of 40.69. Weight loss needed 7. Advised to cut back on alcohol. Does not get shaky if he does not drink. DISCHARGE CONDITIONS:  Stable, discharge to home today. CONSULTS OBTAINED:   DRUG ALLERGIES:   Allergies  Allergen Reactions  . Amoxicillin Nausea And Vomiting, Nausea Only and Other (See Comments)    Other Reaction: GI Upset   DISCHARGE MEDICATIONS:   Allergies as of 01/23/2019      Reactions   Amoxicillin Nausea And Vomiting, Nausea Only, Other (See Comments)   Other Reaction: GI Upset      Medication List    TAKE these medications   amphetamine-dextroamphetamine 10 MG 24 hr capsule Commonly known as: Adderall XR Take 1 capsule (10 mg total) by mouth daily.   atorvastatin 40 MG tablet Commonly known as: LIPITOR TAKE ONE (1) TABLET BY MOUTH ONCE DAILY What changed:   how much to take  how to  take this  when to take this  additional instructions   cholestyramine light 4 GM/DOSE powder Commonly known as: PREVALITE Take 4 g by mouth at bedtime.   ciprofloxacin 500 MG tablet Commonly known as: Cipro Take 1 tablet (500 mg total) by mouth 2 (two) times daily for 7 days.   Co Q-10 200 MG Caps Take 200 mg by mouth daily.   fexofenadine 180 MG tablet Commonly known as: ALLEGRA Take 180 mg by mouth daily.   finasteride 5 MG tablet Commonly known as: PROSCAR Take 5 mg by mouth daily.   Fish Oil 1200 MG Caps Take 1 capsule by mouth daily.   fluticasone 50 MCG/ACT nasal spray Commonly known as: FLONASE Place 2 sprays into both nostrils daily.   ibuprofen 200 MG tablet Commonly known as: ADVIL Take 400-800 mg by mouth every 6 (six) hours as needed for fever, mild pain or moderate pain.   JUICE PLUS FIBRE PO Take 3 capsules by mouth at bedtime. 1- Veggies, 1- Fruit, 1-Berry   lisinopril 5 MG tablet Commonly known as: ZESTRIL TAKE (1) TABLET BY MOUTH EVERY DAY What changed:   how much to take  how to take this  when to take this  additional instructions   loperamide 2 MG capsule Commonly known as: IMODIUM Take 2 capsules (4 mg total) by mouth every 8 (eight) hours as needed for diarrhea or loose stools. What changed:   how much to take  when to take this  reasons to  take this   omeprazole 20 MG capsule Commonly known as: PRILOSEC Take 20 mg by mouth 2 (two) times a day.   ondansetron 4 MG tablet Commonly known as: Zofran Take 1 tablet (4 mg total) by mouth daily as needed.   PreserVision AREDS Caps Take 1-2 capsules by mouth daily.   vitamin B-12 1000 MCG tablet Commonly known as: CYANOCOBALAMIN Take 1,000 mcg by mouth daily.        DISCHARGE INSTRUCTIONS:  See AVS.  If you experience worsening of your admission symptoms, develop shortness of breath, life threatening emergency, suicidal or homicidal thoughts you must seek medical  attention immediately by calling 911 or calling your MD immediately  if symptoms less severe.  You Must read complete instructions/literature along with all the possible adverse reactions/side effects for all the Medicines you take and that have been prescribed to you. Take any new Medicines after you have completely understood and accpet all the possible adverse reactions/side effects.   Please note  You were cared for by a hospitalist during your hospital stay. If you have any questions about your discharge medications or the care you received while you were in the hospital after you are discharged, you can call the unit and asked to speak with the hospitalist on call if the hospitalist that took care of you is not available. Once you are discharged, your primary care physician will handle any further medical issues. Please note that NO REFILLS for any discharge medications will be authorized once you are discharged, as it is imperative that you return to your primary care physician (or establish a relationship with a primary care physician if you do not have one) for your aftercare needs so that they can reassess your need for medications and monitor your lab values.    On the day of Discharge:  VITAL SIGNS:  Blood pressure 131/69, pulse 66, temperature 97.9 F (36.6 C), temperature source Oral, resp. rate 18, height 6' (1.829 m), weight 136.1 kg, SpO2 98 %. PHYSICAL EXAMINATION:  GENERAL:  58 y.o.-year-old patient lying in the bed with no acute distress.  Morbid obesity. EYES: Pupils equal, round, reactive to light and accommodation. No scleral icterus. Extraocular muscles intact.  HEENT: Head atraumatic, normocephalic. Oropharynx and nasopharynx clear.  NECK:  Supple, no jugular venous distention. No thyroid enlargement, no tenderness.  LUNGS: Normal breath sounds bilaterally, no wheezing, rales,rhonchi or crepitation. No use of accessory muscles of respiration.  CARDIOVASCULAR: S1, S2  normal. No murmurs, rubs, or gallops.  ABDOMEN: Soft, non-tender, non-distended. Bowel sounds present. No organomegaly or mass.  EXTREMITIES: No pedal edema, cyanosis, or clubbing.  NEUROLOGIC: Cranial nerves II through XII are intact. Muscle strength 5/5 in all extremities. Sensation intact. Gait not checked.  PSYCHIATRIC: The patient is alert and oriented x 3.  SKIN: No obvious rash, lesion, or ulcer.  DATA REVIEW:   CBC Recent Labs  Lab 01/22/19 0455  WBC 7.4  HGB 14.8  HCT 43.1  PLT 166    Chemistries  Recent Labs  Lab 01/21/19 1103  01/23/19 0437  NA 133*   < > 137  K 2.8*   < > 3.9  CL 98   < > 104  CO2 20*   < > 26  GLUCOSE 158*   < > 128*  BUN 25*   < > 14  CREATININE 1.46*   < > 0.67  CALCIUM 9.3   < > 8.4*  MG 2.0  --  1.7  AST 74*  --   --  ALT 73*  --   --   ALKPHOS 92  --   --   BILITOT 1.5*  --  1.0   < > = values in this interval not displayed.     Microbiology Results  Results for orders placed or performed during the hospital encounter of 01/21/19  Novel Coronavirus, NAA (hospital order; send-out to ref lab)     Status: None   Collection Time: 01/21/19  3:17 PM   Specimen: Nasopharyngeal Swab; Respiratory  Result Value Ref Range Status   SARS-CoV-2, NAA NOT DETECTED NOT DETECTED Final    Comment: (NOTE) This test was developed and its performance characteristics determined by [facility]. This test has not been FDA cleared or approved. This test has been authorized by FDA under an Emergency Use Authorization (EUA). This test is only authorized for the duration of time the declaration that circumstances exist justifying the authorization of the emergency use of in vitro diagnostic tests for detection of SARS-CoV-2 virus and/or diagnosis of COVID-19 infection under section 564(b)(1) of the Act, 21 U.S.C. 952WUX-3(K)(4360bbb-3(b)(1), unless the authorization is terminated or revoked sooner. When diagnostic testing is negative, the possibility of a  false negative result should be considered in the context of a patient's recent exposures and the presence of clinical signs and symptoms consistent with COVID-19. An individual without symptoms of COVID-19 and who is not shedding SARS-CoV-2 virus would expect to have a negative (not detected) result in this assay. Performed  At: [facility] [address] [place], [place] [id number] Jolene SchimkeNagendra Sanjai MD GU:[id number]:[phone]    Coronavirus Source NASOPHARYNGEAL  Final    Comment: Performed at [facility], [address] [place], [place] [address]  CULTURE, BLOOD (ROUTINE X 2) w Reflex to ID Panel     Status: None (Preliminary result)   Collection Time: 01/21/19  3:56 PM   Specimen: BLOOD  Result Value Ref Range Status   Specimen Description BLOOD BLOOD LEFT FOREARM  Final   Special Requests   Final    BOTTLES DRAWN AEROBIC AND ANAEROBIC Blood Culture results may not be optimal due to an excessive volume of blood received in culture bottles   Culture   Final    NO GROWTH 2 DAYS Performed at [facility], [address] [place], [place] [address]    Report Status PENDING  Incomplete  CULTURE, BLOOD (ROUTINE X 2) w Reflex to ID Panel     Status: None (Preliminary result)   Collection Time: 01/21/19  3:56 PM   Specimen: BLOOD  Result Value Ref Range Status   Specimen Description BLOOD BLOOD RIGHT FOREARM  Final   Special Requests   Final    BOTTLES DRAWN AEROBIC AND ANAEROBIC Blood Culture results may not be optimal due to an excessive volume of blood received in culture bottles   Culture   Final    NO GROWTH 2 DAYS Performed at [facility], [address] [place], [place] [address]    Report Status PENDING  Incomplete    RADIOLOGY:  No results found.   Management plans discussed with the patient, family and they are in agreement.  CODE STATUS: Full Code   TOTAL TIME TAKING CARE OF THIS PATIENT: 32 minutes.    Shaune PollackQing Billi Bright M.D on 01/23/2019  at 10:33 AM  Between 7am to 6pm - Pager - [phone]  After 6pm go to [email or web address] - Therapist, nutritionalpassword EPAS [facility]  [facility] [place] [facility]  Office  [phone]  CC: Primary care  physician; Kathyrn Drown, MD   Note: This dictation was prepared with Dragon dictation along with smaller phrase technology. Any transcriptional errors that result from this process are unintentional.

## 2019-01-23 NOTE — Progress Notes (Signed)
Pt report first solid stool during shift. No reports of pain during shift. No other signs of distress noted. Will continue to monitor.

## 2019-01-23 NOTE — Progress Notes (Signed)
Pt is being discharge home. AVS papers given and explained to pt. Pt verbalized understanding.  Meds and f/u appointments reviewed. Rx given. Awaiting transportation.

## 2019-01-24 ENCOUNTER — Telehealth: Payer: Self-pay | Admitting: Family Medicine

## 2019-01-24 NOTE — Telephone Encounter (Signed)
Nurse's-patient recently discharged from the hospital. Please call patient, let them know that we are aware that they were discharged from the hospital. Please schedule them to follow-up with [place] within the next 7 days. Advised the patient to bring all of their medications with him to the visit. Please inquire if they are having any acute issues currently and documented accordingly.  Nurses-patient in the hospital for dehydration Salmonella gastroenteritis and electrolyte issues  I recommend a follow-up visit next week we will be doing follow-up lab work after visit  If patient defers or would rather do virtual visit -a virtual visit will be acceptable

## 2019-01-24 NOTE — Telephone Encounter (Signed)
Patient notified and scheduled a virtual visit next week with Dr Nicki Reaper

## 2019-01-26 LAB — CULTURE, BLOOD (ROUTINE X 2)
Culture: NO GROWTH
Culture: NO GROWTH

## 2019-02-01 ENCOUNTER — Ambulatory Visit (INDEPENDENT_AMBULATORY_CARE_PROVIDER_SITE_OTHER): Payer: PRIVATE HEALTH INSURANCE | Admitting: Family Medicine

## 2019-02-01 ENCOUNTER — Other Ambulatory Visit: Payer: Self-pay

## 2019-02-01 DIAGNOSIS — R7303 Prediabetes: Secondary | ICD-10-CM | POA: Diagnosis not present

## 2019-02-01 DIAGNOSIS — E7849 Other hyperlipidemia: Secondary | ICD-10-CM | POA: Diagnosis not present

## 2019-02-01 DIAGNOSIS — A020 Salmonella enteritis: Secondary | ICD-10-CM

## 2019-02-01 NOTE — Progress Notes (Signed)
   Subjective:    Patient ID: Phillip Spencer, male    DOB: 08-20-1960, 58 y.o.   MRN: [id number]  HPI Hospital follow-up Significant amount time spent with patient discussing his lab results as well as hospital stay Patient doing much better currently Denies any major issues or setbacks States energy level doing better alertness doing better denies abdominal pain His COVID testing was negative Was positive for Salmonella but this was treated with Cipro. Patient calls for a follow up on recent hospitalization for Salmonella. Patient states he has finished his Cipro and is feeling great.  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  Location: Patient: office Provider: home   I discussed the limitations of evaluation and management by telemedicine and the availability of in person appointments. The patient expressed understanding and agreed to proceed.  History of Present Illness:    Observations/Objective:   Assessment and Plan:   Follow Up Instructions:    I discussed the assessment and treatment plan with the patient. The patient was provided an opportunity to ask questions and all were answered. The patient agreed with the plan and demonstrated an understanding of the instructions.   The patient was advised to call back or seek an in-person evaluation if the symptoms worsen or if the condition fails to improve as anticipated.  I provided 15 minutes of non-face-to-face time during this encounter.      Review of Systems   Constitutional: Negative for activity change.  HENT: Negative for congestion and rhinorrhea.   Respiratory: Negative for cough and shortness of breath.   Cardiovascular: Negative for chest pain.  Gastrointestinal: Negative for abdominal pain, diarrhea, nausea and vomiting.  Genitourinary: Negative for dysuria and hematuria.  Neurological: Negative for weakness and headaches.  Psychiatric/Behavioral: Negative for behavioral problems and confusion.       Objective:   Physical Exam   Today's visit was via telephone Physical exam was not possible for this visit      Assessment & Plan:  Dehydration along with syncope and anoxic seizure all doing well now no further work-up necessary patient is okay to drive  I do recommend follow-up electrolytes and magnesium because of low potassium while in the hospital and low calcium  Patient will keep all regular follow-up visits

## 2019-02-25 LAB — BASIC METABOLIC PANEL
BUN/Creatinine Ratio: 13 (ref 9–20)
BUN: 12 mg/dL (ref 6–24)
CO2: 25 mmol/L (ref 20–29)
Calcium: 9.4 mg/dL (ref 8.7–10.2)
Chloride: 105 mmol/L (ref 96–106)
Creatinine, Ser: 0.93 mg/dL (ref 0.76–1.27)
GFR calc Af Amer: 105 mL/min/{1.73_m2} (ref >59)
GFR calc non Af Amer: 91 mL/min/{1.73_m2} (ref >59)
Glucose: 108 mg/dL — ABNORMAL HIGH (ref 65–99)
Potassium: 4.2 mmol/L (ref 3.5–5.2)
Sodium: 145 mmol/L — ABNORMAL HIGH (ref 134–144)

## 2019-02-25 LAB — MAGNESIUM: Magnesium: 1.8 mg/dL (ref 1.6–2.3)

## 2019-02-26 ENCOUNTER — Encounter: Payer: Self-pay | Admitting: Family Medicine

## 2019-03-22 ENCOUNTER — Telehealth: Payer: Self-pay | Admitting: Family Medicine

## 2019-03-23 NOTE — Telephone Encounter (Signed)
I will do the refill but please schedule virtual visit for later in September or in early October

## 2019-03-24 NOTE — Telephone Encounter (Signed)
Appt scheduled for October 1st.

## 2019-03-24 NOTE — Telephone Encounter (Signed)
Pt notified on voicemail that med was sent to pharm [facility]

## 2019-04-14 ENCOUNTER — Ambulatory Visit (INDEPENDENT_AMBULATORY_CARE_PROVIDER_SITE_OTHER): Payer: PRIVATE HEALTH INSURANCE | Admitting: Family Medicine

## 2019-04-14 DIAGNOSIS — E7849 Other hyperlipidemia: Secondary | ICD-10-CM

## 2019-04-14 DIAGNOSIS — R7303 Prediabetes: Secondary | ICD-10-CM | POA: Diagnosis not present

## 2019-04-14 DIAGNOSIS — Z23 Encounter for immunization: Secondary | ICD-10-CM

## 2019-04-14 DIAGNOSIS — F909 Attention-deficit hyperactivity disorder, unspecified type: Secondary | ICD-10-CM | POA: Diagnosis not present

## 2019-04-14 DIAGNOSIS — Z79899 Other long term (current) drug therapy: Secondary | ICD-10-CM

## 2019-04-14 DIAGNOSIS — Z125 Encounter for screening for malignant neoplasm of prostate: Secondary | ICD-10-CM

## 2019-04-14 MED ORDER — LISINOPRIL 5 MG PO TABS: ORAL_TABLET | ORAL | 1 refills | Status: DC

## 2019-04-14 MED ORDER — AMPHETAMINE-DEXTROAMPHET ER 10 MG PO CP24: ORAL_CAPSULE | ORAL | 0 refills | Status: DC

## 2019-04-14 MED ORDER — ATORVASTATIN CALCIUM 40 MG PO TABS: ORAL_TABLET | ORAL | 1 refills | Status: DC

## 2019-04-14 NOTE — Progress Notes (Signed)
Subjective:    Patient ID: Phillip Spencer, male    DOB: 08-May-1961, 58 y.o.   MRN: [id number]  HPI Patient was seen today for ADD checkup.  This patient does have ADD.  Patient takes medications for this.  If this does help control overall symptoms.  Please see below. -weight, vital signs reviewed.  The following items were covered. -Compliance with medication : Adderall 10 mg daily   -Problems with completing homework, paying attention/taking good notes in school: none  -grades: n/a This patient has adult ADD.  Has difficult time staying on track at work He states when he does take the medicine is dramatically improved with his workplace.  States it helps him greatly without it he has a hard time functioning and staying on track  Patient here for follow-up regarding cholesterol.  The patient does have hyperlipidemia.  Patient does try to maintain a reasonable diet.  Patient does take the medication on a regular basis.  Denies missing a dose.  The patient denies any obvious side effects.  Prior blood work results reviewed with the patient.  The patient is aware of his cholesterol goals and the need to keep it under good control to lessen the risk of disease. He does take his cholesterol medicine regular basis we will be checking lab work and also be doing a wellness physical in the near future  He states blood pressure under good control he is exercising watching salt in the diet trying to be careful with his diet and taking his medication. - Eating patterns : eats well  -sleeping: sleeping well   -Additional issues or questions: none  Virtual Visit via Video Note  I connected with Phillip Spencer on 04/14/19 at  8:30 AM EDT by a video enabled telemedicine application and verified that I am speaking with the correct person using two identifiers.  Location: Patient: home Provider: office   I discussed the limitations of evaluation and management by telemedicine and the  availability of in person appointments. The patient expressed understanding and agreed to proceed.  History of Present Illness:    Observations/Objective:   Assessment and Plan:   Follow Up Instructions:    I discussed the assessment and treatment plan with the patient. The patient was provided an opportunity to ask questions and all were answered. The patient agreed with the plan and demonstrated an understanding of the instructions.   The patient was advised to call back or seek an in-person evaluation if the symptoms worsen or if the condition fails to improve as anticipated.  I provided 16 minutes of non-face-to-face time during this encounter.   Vicente Males, LPN    Review of Systems  Constitutional: Negative for activity change, appetite change and fatigue.  HENT: Negative for congestion and rhinorrhea.   Respiratory: Negative for cough and shortness of breath.   Cardiovascular: Negative for chest pain and leg swelling.  Gastrointestinal: Negative for abdominal pain, nausea and vomiting.  Neurological: Negative for dizziness and headaches.  Psychiatric/Behavioral: Negative for agitation and behavioral problems.       Objective:   Physical Exam  Today's visit was via telephone Physical exam was not possible for this visit       Assessment & Plan:  HTN- Patient was seen today as part of a visit regarding hypertension. The importance of healthy diet and regular physical activity was discussed. The importance of compliance with medications discussed.  Ideal goal is to keep blood pressure low elevated levels  certainly below 140/90 when possible.  The patient was counseled that keeping blood pressure under control lessen his risk of complications.  The importance of regular follow-ups was discussed with the patient.  Low-salt diet such as DASH recommended.  Regular physical activity was recommended as well.  Patient was advised to keep regular follow-ups. Relates  blood pressure good control he will check it periodically with his monitor and bring the monitor with him on his next visit  Prediabetes minimizing starches in the diet we will check an A1c  Hyperlipidemia he will take his medicine watch diet we will check lab work previous labs look good  Morbid obesity he will continue to watch diet stay physically active try to lose weight  Adult ADD 3 prescription sent in patient with benefits from the medication continue this follow-up later this year for wellness check

## 2019-06-22 LAB — BASIC METABOLIC PANEL
BUN/Creatinine Ratio: 16 (ref 9–20)
BUN: 13 mg/dL (ref 6–24)
CO2: 24 mmol/L (ref 20–29)
Calcium: 9.7 mg/dL (ref 8.7–10.2)
Chloride: 99 mmol/L (ref 96–106)
Creatinine, Ser: 0.81 mg/dL (ref 0.76–1.27)
GFR calc Af Amer: 114 mL/min/{1.73_m2} (ref >59)
GFR calc non Af Amer: 99 mL/min/{1.73_m2} (ref >59)
Glucose: 105 mg/dL — ABNORMAL HIGH (ref 65–99)
Potassium: 4.1 mmol/L (ref 3.5–5.2)
Sodium: 139 mmol/L (ref 134–144)

## 2019-06-22 LAB — HEPATIC FUNCTION PANEL
ALT: 73 IU/L — ABNORMAL HIGH (ref 0–44)
AST: 43 IU/L — ABNORMAL HIGH (ref 0–40)
Albumin: 4.7 g/dL (ref 3.8–4.9)
Alkaline Phosphatase: 116 IU/L (ref 39–117)
Bilirubin Total: 1 mg/dL (ref 0.0–1.2)
Bilirubin, Direct: 0.24 mg/dL (ref 0.00–0.40)
Total Protein: 7.1 g/dL (ref 6.0–8.5)

## 2019-06-22 LAB — LIPID PANEL
Chol/HDL Ratio: 3.3 ratio (ref 0.0–5.0)
Cholesterol, Total: 229 mg/dL — ABNORMAL HIGH (ref 100–199)
HDL: 69 mg/dL (ref >39)
LDL Chol Calc (NIH): 129 mg/dL — ABNORMAL HIGH (ref 0–99)
Triglycerides: 180 mg/dL — ABNORMAL HIGH (ref 0–149)
VLDL Cholesterol Cal: 31 mg/dL (ref 5–40)

## 2019-06-22 LAB — HEMOGLOBIN A1C
Est. average glucose Bld gHb Est-mCnc: 143 mg/dL
Hgb A1c MFr Bld: 6.6 % — ABNORMAL HIGH (ref 4.8–5.6)

## 2019-06-22 LAB — PSA: Prostate Specific Ag, Serum: 0.5 ng/mL (ref 0.0–4.0)

## 2019-06-24 ENCOUNTER — Encounter: Payer: PRIVATE HEALTH INSURANCE | Admitting: Family Medicine

## 2019-06-29 ENCOUNTER — Ambulatory Visit (INDEPENDENT_AMBULATORY_CARE_PROVIDER_SITE_OTHER): Payer: PRIVATE HEALTH INSURANCE | Admitting: Family Medicine

## 2019-06-29 DIAGNOSIS — F909 Attention-deficit hyperactivity disorder, unspecified type: Secondary | ICD-10-CM | POA: Diagnosis not present

## 2019-06-29 DIAGNOSIS — E781 Pure hyperglyceridemia: Secondary | ICD-10-CM | POA: Diagnosis not present

## 2019-06-29 DIAGNOSIS — R7303 Prediabetes: Secondary | ICD-10-CM

## 2019-06-29 DIAGNOSIS — E7849 Other hyperlipidemia: Secondary | ICD-10-CM

## 2019-06-29 DIAGNOSIS — Z0000 Encounter for general adult medical examination without abnormal findings: Secondary | ICD-10-CM

## 2019-06-29 MED ORDER — AMPHETAMINE-DEXTROAMPHET ER 10 MG PO CP24: ORAL_CAPSULE | ORAL | 0 refills | Status: DC

## 2019-06-29 MED ORDER — ROSUVASTATIN CALCIUM 40 MG PO TABS: 40.0000 mg | ORAL_TABLET | Freq: Every day | ORAL | 1 refills | Status: DC

## 2019-06-29 NOTE — Progress Notes (Signed)
Subjective:    Patient ID: Phillip Spencer, male    DOB: 25-Jan-1961, 58 y.o.   MRN: [id number]  HPI  The patient comes in today for a wellness visit.    A review of their health history was completed.  A review of medications was also completed.  Any needed refills; yes  Eating habits: trying to do better- put on some weight now 326lbs  Falls/  MVA accidents in past few months: none  Regular exercise: here and there  Specialist pt sees on regular basis: GI doctor  Preventative health issues were discussed.   Additional concerns: weight  Patient also has some other issues going on Morbid obesity The patient's BMI is calculated.  The patient does have obesity.  The patient does try to some degree staying active and watching diet.  It is in the vital signs and acknowledged.  It is above the recommended BMI for the patient's height and weight.  The patient has been counseled regarding healthy diet, restricted portions, avoiding excessive carbohydrates/sugary foods, and increase physical activity as health permits.  It is in the patient's best interest to lower the risk of secondary illness including heart disease strokes and cancer by losing weight.  The patient acknowledges this information.  Patient also has fatty liver with elevated liver enzymes I told him that this needs to improve to lessen his risk of cirrhosis he is going to work hard on his weight check his labs again in 3 to 4 months  A1c is gone up indicates diabetes I encouraged him to consider Metformin he does not want to go on medicine currently he will work hard on diet and try to lose weight and recheck labs again in 3 months  Hyperlipidemia not under good control need to make change in medicine to rosuvastatin to get it under better control.  Patient up-to-date on colonoscopy next 07/2021 patient did have his flu shot Virtual Visit via Video Note  I connected with Phillip Spencer on 06/29/19 at  2:30  PM EST by a video enabled telemedicine application and verified that I am speaking with the correct person using two identifiers.  Location: Patient: home Provider: office   I discussed the limitations of evaluation and management by telemedicine and the availability of in person appointments. The patient expressed understanding and agreed to proceed.  History of Present Illness:    Observations/Objective:   Assessment and Plan:   Follow Up Instructions:    I discussed the assessment and treatment plan with the patient. The patient was provided an opportunity to ask questions and all were answered. The patient agreed with the plan and demonstrated an understanding of the instructions.   The patient was advised to call back or seek an in-person evaluation if the symptoms worsen or if the condition fails to improve as anticipated.  I provided 30 minutes of non-face-to-face time during this encounter.      Review of Systems  Constitutional: Negative for activity change, appetite change and fever.  HENT: Negative for congestion and rhinorrhea.   Eyes: Negative for discharge.  Respiratory: Negative for cough and wheezing.   Cardiovascular: Negative for chest pain.  Gastrointestinal: Negative for abdominal pain, blood in stool and vomiting.  Genitourinary: Negative for difficulty urinating and frequency.  Musculoskeletal: Negative for neck pain.  Skin: Negative for rash.  Allergic/Immunologic: Negative for environmental allergies and food allergies.  Neurological: Negative for weakness and headaches.  Psychiatric/Behavioral: Negative for agitation.  Objective:   Physical Exam  Patient had virtual visit Appears to be in no distress Atraumatic Neuro able to relate and oriented No apparent resp distress Color normal  Patient has adult ADD does well on medicine requests refills these were sent and     Assessment & Plan:  1. Attention deficit hyperactivity  disorder (ADHD), unspecified ADHD type ADD does well on medicine refills given drug registry checked  2. Morbid obesity (HCC) Morbid obesity watch portions exercise more bring weight down  3. Hypertriglyceridemia Hyperlipidemia change medication to rosuvastatin to get better control - Lipid Profile  4. Prediabetes Diabetes concerning very important the patient to get this down he does not want to start Metformin currently he will check lab work in 3 months with follow-up visit - Basic Metabolic Panel (BMET) - Hemoglobin A1c  5. Well adult exam Adult wellness-complete.wellness physical was conducted today. Importance of diet and exercise were discussed in detail.  In addition to this a discussion regarding safety was also covered. We also reviewed over immunizations and gave recommendations regarding current immunization needed for age.  In addition to this additional areas were also touched on including: Preventative health exams needed:  Colonoscopy 2023 Obviously we could not do prostate exam today the patient did not want to come to the office because of fear of Covid patient at high risk for Covid Covid protection discussed  Patient was advised yearly wellness exam

## 2019-07-21 ENCOUNTER — Other Ambulatory Visit: Payer: Self-pay | Admitting: Family Medicine

## 2019-07-21 NOTE — Telephone Encounter (Signed)
3 script sent in back in December. Contacted patient and made him aware that pharmacy should have scripts. Pt is going to call pharmacy. Pt verbalized understanding

## 2019-07-21 NOTE — Telephone Encounter (Signed)
Wellness 06/29/19

## 2019-10-01 ENCOUNTER — Ambulatory Visit: Payer: PRIVATE HEALTH INSURANCE

## 2019-10-01 ENCOUNTER — Ambulatory Visit: Payer: PRIVATE HEALTH INSURANCE | Attending: Internal Medicine

## 2019-10-01 DIAGNOSIS — Z23 Encounter for immunization: Secondary | ICD-10-CM

## 2019-10-01 NOTE — Progress Notes (Signed)
   Covid-19 Vaccination Clinic  Name:  Phillip Spencer    MRN: [id number] DOB: 05-15-1961  10/01/2019  Mr. Phillip Spencer was observed post Covid-19 immunization for 15 minutes without incident. He was provided with Vaccine Information Sheet and instruction to access the V-Safe system.   Mr. Phillip Spencer was instructed to call 911 with any severe reactions post vaccine: Marland Kitchen Difficulty breathing  . Swelling of face and throat  . A fast heartbeat  . A bad rash all over body  . Dizziness and weakness   Immunizations Administered    Name Date Dose VIS Date Route   Moderna COVID-19 Vaccine 10/01/2019  8:28 AM 0.5 mL 06/14/2019 Intramuscular   Manufacturer: Moderna   Lot: [id number]   NDC: 70623-762-83

## 2019-10-17 ENCOUNTER — Other Ambulatory Visit: Payer: Self-pay | Admitting: Family Medicine

## 2019-10-17 NOTE — Telephone Encounter (Signed)
May have 90-day needs follow-up by June 

## 2019-10-26 ENCOUNTER — Other Ambulatory Visit: Payer: Self-pay | Admitting: Family Medicine

## 2019-10-26 DIAGNOSIS — Z79899 Other long term (current) drug therapy: Secondary | ICD-10-CM

## 2019-10-26 DIAGNOSIS — R5383 Other fatigue: Secondary | ICD-10-CM

## 2019-10-26 DIAGNOSIS — R7303 Prediabetes: Secondary | ICD-10-CM

## 2019-10-26 DIAGNOSIS — E785 Hyperlipidemia, unspecified: Secondary | ICD-10-CM

## 2019-10-26 NOTE — Telephone Encounter (Signed)
May pend Patient will need to do a follow-up visit before further refills

## 2019-10-27 NOTE — Telephone Encounter (Signed)
lvm to schedule visit  

## 2019-10-27 NOTE — Telephone Encounter (Signed)
Patient scheduled for May 11th.  Wife having surgery so he needed to wait a little while before he could schedule.  Would like a refill to get him to his appt.  Also if he needs labs done prior to his appt will need orders put in for that as well.

## 2019-10-28 ENCOUNTER — Telehealth: Payer: Self-pay | Admitting: Family Medicine

## 2019-10-28 NOTE — Telephone Encounter (Signed)
May may pend the medication Also A1c, metabolic 7, lipid

## 2019-10-28 NOTE — Telephone Encounter (Signed)
Patient is requesting adderall 10 mg XR hr capsules to be called into [facility]. Last seen 06/29/2019,last filled 09/19/2019

## 2019-10-31 ENCOUNTER — Other Ambulatory Visit: Payer: Self-pay | Admitting: Family Medicine

## 2019-10-31 NOTE — Telephone Encounter (Signed)
Should be noted medication was sent in patient will do regular follow-up office visit in the near future

## 2019-11-01 ENCOUNTER — Ambulatory Visit: Payer: PRIVATE HEALTH INSURANCE

## 2019-11-02 ENCOUNTER — Ambulatory Visit: Payer: PRIVATE HEALTH INSURANCE

## 2019-11-08 ENCOUNTER — Ambulatory Visit: Payer: PRIVATE HEALTH INSURANCE | Attending: Internal Medicine

## 2019-11-08 DIAGNOSIS — Z23 Encounter for immunization: Secondary | ICD-10-CM

## 2019-11-08 NOTE — Progress Notes (Signed)
   Covid-19 Vaccination Clinic  Name:  Phillip Spencer    MRN: [id number] DOB: 08/23/1960  11/08/2019  Phillip Spencer was observed post Covid-19 immunization for 15 minutes without incident. He was provided with Vaccine Information Sheet and instruction to access the V-Safe system.   Phillip Spencer was instructed to call 911 with any severe reactions post vaccine: Marland Kitchen Difficulty breathing  . Swelling of face and throat  . A fast heartbeat  . A bad rash all over body  . Dizziness and weakness   Immunizations Administered    Name Date Dose VIS Date Route   Moderna COVID-19 Vaccine 11/08/2019 11:50 AM 0.5 mL 06/2019 Intramuscular   Manufacturer: Moderna   Lot: [id number]   NDC: 04888-916-94

## 2019-11-19 LAB — BASIC METABOLIC PANEL
BUN/Creatinine Ratio: 13 (ref 9–20)
BUN: 11 mg/dL (ref 6–24)
CO2: 25 mmol/L (ref 20–29)
Calcium: 9.7 mg/dL (ref 8.7–10.2)
Chloride: 102 mmol/L (ref 96–106)
Creatinine, Ser: 0.87 mg/dL (ref 0.76–1.27)
GFR calc Af Amer: 110 mL/min/{1.73_m2} (ref >59)
GFR calc non Af Amer: 95 mL/min/{1.73_m2} (ref >59)
Glucose: 116 mg/dL — ABNORMAL HIGH (ref 65–99)
Potassium: 4.4 mmol/L (ref 3.5–5.2)
Sodium: 141 mmol/L (ref 134–144)

## 2019-11-19 LAB — LIPID PANEL
Chol/HDL Ratio: 2.6 ratio (ref 0.0–5.0)
Cholesterol, Total: 195 mg/dL (ref 100–199)
HDL: 74 mg/dL (ref >39)
LDL Chol Calc (NIH): 95 mg/dL (ref 0–99)
Triglycerides: 150 mg/dL — ABNORMAL HIGH (ref 0–149)
VLDL Cholesterol Cal: 26 mg/dL (ref 5–40)

## 2019-11-19 LAB — HEMOGLOBIN A1C
Est. average glucose Bld gHb Est-mCnc: 137 mg/dL
Hgb A1c MFr Bld: 6.4 % — ABNORMAL HIGH (ref 4.8–5.6)

## 2019-11-22 ENCOUNTER — Telehealth: Payer: Self-pay | Admitting: *Deleted

## 2019-11-22 ENCOUNTER — Telehealth (INDEPENDENT_AMBULATORY_CARE_PROVIDER_SITE_OTHER): Payer: PRIVATE HEALTH INSURANCE | Admitting: Family Medicine

## 2019-11-22 ENCOUNTER — Other Ambulatory Visit: Payer: Self-pay

## 2019-11-22 DIAGNOSIS — F909 Attention-deficit hyperactivity disorder, unspecified type: Secondary | ICD-10-CM

## 2019-11-22 DIAGNOSIS — R7303 Prediabetes: Secondary | ICD-10-CM

## 2019-11-22 DIAGNOSIS — E7849 Other hyperlipidemia: Secondary | ICD-10-CM | POA: Diagnosis not present

## 2019-11-22 MED ORDER — ROSUVASTATIN CALCIUM 40 MG PO TABS: 40.0000 mg | ORAL_TABLET | Freq: Every day | ORAL | 1 refills | Status: DC

## 2019-11-22 MED ORDER — LISINOPRIL 5 MG PO TABS: ORAL_TABLET | ORAL | 1 refills | Status: DC

## 2019-11-22 MED ORDER — AMPHETAMINE-DEXTROAMPHET ER 10 MG PO CP24: ORAL_CAPSULE | ORAL | 0 refills | Status: DC

## 2019-11-22 MED ORDER — FINASTERIDE 5 MG PO TABS: 5.0000 mg | ORAL_TABLET | Freq: Every day | ORAL | 1 refills | Status: DC

## 2019-11-22 NOTE — Progress Notes (Signed)
Subjective:    Patient ID: Phillip Spencer, male    DOB: 10/10/1960, 59 y.o.   MRN: [id number]  HPI This patient has adult ADD. Takes medication responsibly. Medication does help the patient focus in be more functional. Patient relates that they are or not abusing the medication or misusing the medication. The patient understands that if they're having any negative side effects such as elevated high blood pressure severe headaches they would need stop the medication follow-up immediately. They also understand that the prescriptions are to last for 3 months then the patient will need to follow-up before having further prescriptions.  Patient compliance yes  Does medication help patient function /attention better  Yes absolutely, really helps   Side effects: none  Hypertension: Last BP check at home last Wednesday 127/81.  Requests refill on finasteride.  Patient states urinary symptoms doing well Patient for blood pressure check up.  The patient does have hypertension.  The patient is on medication.  Patient relates compliance with meds. Todays BP reviewed with the patient. Patient denies issues with medication. Patient relates reasonable diet. Patient tries to minimize salt. Patient aware of BP goals.  Patient here for follow-up regarding cholesterol.  The patient does have hyperlipidemia.  Patient does try to maintain a reasonable diet.  Patient does take the medication on a regular basis.  Denies missing a dose.  The patient denies any obvious side effects.  Prior blood work results reviewed with the patient.  The patient is aware of his cholesterol goals and the need to keep it under good control to lessen the risk of disease.   See PHQ9 and GAD 7. Review of Systems  Constitutional: Negative for activity change, fatigue and fever.  HENT: Negative for congestion and rhinorrhea.   Respiratory: Negative for cough and shortness of breath.   Cardiovascular: Negative for chest pain and  leg swelling.  Gastrointestinal: Negative for abdominal pain, diarrhea and nausea.  Genitourinary: Negative for dysuria and hematuria.  Neurological: Negative for weakness and headaches.  Psychiatric/Behavioral: Negative for agitation and behavioral problems.       Objective:   Physical Exam  Today's visit was via telephone Physical exam was not possible for this visit  Virtual Visit via Video Note  I connected with Phillip Spencer on 11/22/19 at  8:20 AM EDT by a video enabled telemedicine application and verified that I am speaking with the correct person using two identifiers.  Location: Patient: home Provider: office   I discussed the limitations of evaluation and management by telemedicine and the availability of in person appointments. The patient expressed understanding and agreed to proceed.  History of Present Illness:    Observations/Objective:   Assessment and Plan:   Follow Up Instructions:    I discussed the assessment and treatment plan with the patient. The patient was provided an opportunity to ask questions and all were answered. The patient agreed with the plan and demonstrated an understanding of the instructions.   The patient was advised to call back or seek an in-person evaluation if the symptoms worsen or if the condition fails to improve as anticipated.  I provided 30 minutes of non-face-to-face time during this encounter.  Between reviewing lab work, interacting with patient, documentation, reviewing medical record, prescribing.     Assessment & Plan:  1. Morbid obesity (HCC) Importance of watching caloric intake staying physically active trying to lose weight discussed  2. Attention deficit hyperactivity disorder (ADHD), unspecified ADHD type Has ADD New prescription  sent in Follow-up if ongoing troubles Drug registry checked Does well on the medicine it allows him to focus at work  3. Prediabetes Patient does not want to go on  medications currently will watch diet stay active and recheck labs again in the fall  4. Other hyperlipidemia Continue current medication.  Patient also needs refill on finasteride we will monitor prostate and PSA on a yearly basis

## 2019-11-22 NOTE — Telephone Encounter (Signed)
Mr. Phillip Spencer, Phillip Spencer are scheduled for a virtual visit with your provider today.    Just as we do with appointments in the office, we must obtain your consent to participate.  Your consent will be active for this visit and any virtual visit you may have with one of our providers in the next 365 days.    If you have a MyChart account, I can also send a copy of this consent to you electronically.  All virtual visits are billed to your insurance company just like a traditional visit in the office.  As this is a virtual visit, video technology does not allow for your provider to perform a traditional examination.  This may limit your provider's ability to fully assess your condition.  If your provider identifies any concerns that need to be evaluated in person or the need to arrange testing such as labs, EKG, etc, we will make arrangements to do so.    Although advances in technology are sophisticated, we cannot ensure that it will always work on either your end or our end.  If the connection with a video visit is poor, we may have to switch to a telephone visit.  With either a video or telephone visit, we are not always able to ensure that we have a secure connection.   I need to obtain your verbal consent now.   Are you willing to proceed with your visit today?   Phillip Spencer has provided verbal consent on 11/22/2019 for a virtual visit (video or telephone).   Haze Rushing, LPN 12/13/930  3:55 AM

## 2019-12-07 ENCOUNTER — Telehealth: Payer: Self-pay | Admitting: Family Medicine

## 2019-12-07 NOTE — Telephone Encounter (Signed)
It would be wise for the patient to do a office visit in person somewhere within the next week  It is difficult via a phone message to know for certain what is causing the discomfort The likelihood of this being due to the vaccine is low It is possible medication could be causing this But it is also possible there could be other physiologic issues going on that are causing the discomfort So therefore it is best to be seen and evaluated potentially lab tests will need to be ordered Thank you

## 2019-12-07 NOTE — Telephone Encounter (Signed)
Patient called and reported that he is having sever joint pain in hips, knees, arms and had Maderna 2nd shot on April 27th. He is concerned with the joint pain he has been working in the yard a lot as well.

## 2019-12-07 NOTE — Telephone Encounter (Signed)
lmtc

## 2019-12-07 NOTE — Telephone Encounter (Signed)
Pt states he has never had this amount of discomfortant in his joints before. Mostly in hips and knees. Had 2nd covid vaccine April 27th and joint pain started about 2 weeks later and it is constant. No fever. States he has been doing more work outdoors lately but joint pain did start before doing the extra work outdoors. No other symptoms.

## 2019-12-08 NOTE — Telephone Encounter (Signed)
Called and discussed with pt. Pt verbalized understanding. He did say he wanted to wait one week to see if any better and he would call back to schedule at that time if still having problems.

## 2020-01-09 ENCOUNTER — Other Ambulatory Visit: Payer: Self-pay

## 2020-01-09 ENCOUNTER — Emergency Department
Admission: EM | Admit: 2020-01-09 | Discharge: 2020-01-09 | Disposition: A | Discharge status: Home / Self Care | Payer: PRIVATE HEALTH INSURANCE | Attending: Emergency Medicine | Admitting: Emergency Medicine

## 2020-01-09 ENCOUNTER — Ambulatory Visit: Payer: Self-pay

## 2020-01-09 DIAGNOSIS — Z880 Allergy status to penicillin: Secondary | ICD-10-CM | POA: Insufficient documentation

## 2020-01-09 DIAGNOSIS — R7303 Prediabetes: Secondary | ICD-10-CM | POA: Insufficient documentation

## 2020-01-09 DIAGNOSIS — R197 Diarrhea, unspecified: Secondary | ICD-10-CM | POA: Insufficient documentation

## 2020-01-09 DIAGNOSIS — I10 Essential (primary) hypertension: Secondary | ICD-10-CM | POA: Diagnosis not present

## 2020-01-09 DIAGNOSIS — T6291XA Toxic effect of unspecified noxious substance eaten as food, accidental (unintentional), initial encounter: Secondary | ICD-10-CM | POA: Insufficient documentation

## 2020-01-09 DIAGNOSIS — L759 Apocrine sweat disorder, unspecified: Secondary | ICD-10-CM | POA: Insufficient documentation

## 2020-01-09 DIAGNOSIS — R1084 Generalized abdominal pain: Secondary | ICD-10-CM | POA: Insufficient documentation

## 2020-01-09 DIAGNOSIS — Z87891 Personal history of nicotine dependence: Secondary | ICD-10-CM | POA: Diagnosis not present

## 2020-01-09 DIAGNOSIS — R6883 Chills (without fever): Secondary | ICD-10-CM | POA: Insufficient documentation

## 2020-01-09 DIAGNOSIS — A059 Bacterial foodborne intoxication, unspecified: Secondary | ICD-10-CM

## 2020-01-09 LAB — COMPREHENSIVE METABOLIC PANEL
ALT: 40 U/L (ref 0–44)
AST: 29 U/L (ref 15–41)
Albumin: 4.1 g/dL (ref 3.5–5.0)
Alkaline Phosphatase: 91 U/L (ref 38–126)
Anion gap: 12 (ref 5–15)
BUN: 12 mg/dL (ref 6–20)
CO2: 21 mmol/L — ABNORMAL LOW (ref 22–32)
Calcium: 9.2 mg/dL (ref 8.9–10.3)
Chloride: 102 mmol/L (ref 98–111)
Creatinine, Ser: 0.83 mg/dL (ref 0.61–1.24)
GFR calc Af Amer: >60 mL/min (ref >60)
GFR calc non Af Amer: >60 mL/min (ref >60)
Glucose, Bld: 130 mg/dL — ABNORMAL HIGH (ref 70–99)
Potassium: 3.9 mmol/L (ref 3.5–5.1)
Sodium: 135 mmol/L (ref 135–145)
Total Bilirubin: 1.2 mg/dL (ref 0.3–1.2)
Total Protein: 7.6 g/dL (ref 6.5–8.1)

## 2020-01-09 LAB — CBC
HCT: 47.3 % (ref 39.0–52.0)
Hemoglobin: 16.8 g/dL (ref 13.0–17.0)
MCH: 32.3 pg (ref 26.0–34.0)
MCHC: 35.5 g/dL (ref 30.0–36.0)
MCV: 91 fL (ref 80.0–100.0)
Platelets: 180 10*3/uL (ref 150–400)
RBC: 5.2 MIL/uL (ref 4.22–5.81)
RDW: 12.6 % (ref 11.5–15.5)
WBC: 10.1 10*3/uL (ref 4.0–10.5)
nRBC: 0 % (ref 0.0–0.2)

## 2020-01-09 LAB — URINALYSIS, COMPLETE (UACMP) WITH MICROSCOPIC
Bacteria, UA: NONE SEEN
Bilirubin Urine: NEGATIVE
Glucose, UA: NEGATIVE mg/dL
Ketones, ur: 5 mg/dL — AB
Leukocytes,Ua: NEGATIVE
Nitrite: NEGATIVE
Protein, ur: 30 mg/dL — AB
Specific Gravity, Urine: 1.03 (ref 1.005–1.030)
pH: 5 (ref 5.0–8.0)

## 2020-01-09 LAB — LIPASE, BLOOD: Lipase: 24 U/L (ref 11–51)

## 2020-01-09 MED ORDER — FAMOTIDINE 20 MG PO TABS
20.0000 mg | ORAL_TABLET | Freq: Two times a day (BID) | ORAL | 0 refills | Status: DC
Start: 2020-01-09 — End: 2020-07-10

## 2020-01-09 MED ORDER — CIPROFLOXACIN HCL 500 MG PO TABS
500.0000 mg | ORAL_TABLET | Freq: Two times a day (BID) | ORAL | 0 refills | Status: AC
Start: 2020-01-09 — End: 2020-01-16

## 2020-01-09 MED ORDER — ONDANSETRON 4 MG PO TBDP
4.0000 mg | ORAL_TABLET | Freq: Three times a day (TID) | ORAL | 0 refills | Status: DC | PRN
Start: 2020-01-09 — End: 2020-07-10

## 2020-01-09 NOTE — ED Triage Notes (Signed)
Pt to the er for abd pain, diarrhea, chills, sweats since Saturday. Pt states he was recently dx with salmonella and had a seizure. Pt is not taking his BP meds as he should. Pt took IBU at home, pepto and 2 zicams.

## 2020-01-09 NOTE — ED Provider Notes (Signed)
[facility] Emergency Department Provider Note  ____________________________________________  Time seen: Approximately 7:48 PM  I have reviewed the triage vital signs and the nursing notes.   HISTORY  Chief Complaint Abdominal Pain    HPI Phillip Spencer is a 59 y.o. male with a history of hypertension GERD and obesity who comes the ED complaining of generalized abdominal pain, watery diarrhea, chills and sweats for the past 2 days which started about 2 hours after eating a biscuit bill.  He has been able to continue eating and drinking at home but is having about 6 watery bowel movements a day.  No vomiting, no fever, no dizziness or syncope.  Abdominal pain is crampy, waxing waning, nonradiating, no aggravating or alleviating factors.  Denies any sick contacts, he has had 2 Moderna Covid vaccines.   Status post cholecystectomy   Past Medical History:  Diagnosis Date  . GERD (gastroesophageal reflux disease)   . Hematuria   . Hyperlipidemia   . Hypertension   . Pre-diabetes   . Renal calculus, right      Patient Active Problem List   Diagnosis Date Noted  . Salmonella gastroenteritis 01/21/2019  . Attention deficit hyperactivity disorder (ADHD) 12/11/2017  . GERD (gastroesophageal reflux disease) 05/16/2015  . Hypertriglyceridemia 11/25/2013  . Morbid obesity (HCC) 11/25/2013  . Hyperlipidemia 02/01/2013  . Prediabetes 02/01/2013     Past Surgical History:  Procedure Laterality Date  . CHOLECYSTECTOMY  2011  . CYSTOSCOPY WITH RETROGRADE PYELOGRAM, URETEROSCOPY AND STENT PLACEMENT Right 09/06/2014   Procedure: CYSTOSCOPY WITH RETROGRADE PYELOGRAM, RIGHT DIAGNOSTIC URETEROSCOPY AND STENT PLACEMENT;  Surgeon: Sebastian Ache, MD;  Location: [facility];  Service: Urology;  Laterality: Right;  . CYSTOSCOPY WITH RETROGRADE PYELOGRAM, URETEROSCOPY AND STENT PLACEMENT Right 09/27/2014   Procedure: CYSTOSCOPY WITH RETROGRADE  PYELOGRAM, RIGHT URETEROSCOPY AND STENT EXCHANGE, LASER LITHOTRIPSY WITH STONE BASKETRY;  Surgeon: Sebastian Ache, MD;  Location: [facility];  Service: Urology;  Laterality: Right;  . FOOT SURGERY Bilateral   . HERNIA REPAIR     ventral x 2  . HOLMIUM LASER APPLICATION Right 09/27/2014   Procedure: HOLMIUM LASER APPLICATION;  Surgeon: Sebastian Ache, MD;  Location: [facility];  Service: Urology;  Laterality: Right;  . KNEE ARTHROSCOPY Right   . NASAL SINUS SURGERY       Prior to Admission medications   Medication Sig Start Date End Date Taking? Authorizing Provider  amphetamine-dextroamphetamine (ADDERALL XR) 10 MG 24 hr capsule TAKE (1) CAPSULE BY MOUTH EVERY DAY 11/22/19   Babs Sciara, MD  amphetamine-dextroamphetamine (ADDERALL XR) 10 MG 24 hr capsule Take one capsule po every day 11/22/19   Babs Sciara, MD  amphetamine-dextroamphetamine (ADDERALL XR) 10 MG 24 hr capsule Take one capsule po each day 11/22/19   Babs Sciara, MD  aspirin EC 81 MG tablet Take 81 mg by mouth daily.    [provider]  cholestyramine light (PREVALITE) 4 GM/DOSE powder Take 4 g by mouth at bedtime.     [provider]  ciprofloxacin (CIPRO) 500 MG tablet Take 1 tablet (500 mg total) by mouth 2 (two) times daily for 7 days. 01/09/20 01/16/20  Sharman Cheek, MD  Coenzyme Q10 (CO Q-10) 200 MG CAPS Take 200 mg by mouth daily.     [provider]  famotidine (PEPCID) 20 MG tablet Take 1 tablet (20 mg total) by mouth 2 (two) times daily. 01/09/20   Sharman Cheek, MD  finasteride [facility]) 5  MG tablet Take 1 tablet (5 mg total) by mouth daily. 11/22/19   Babs Sciara, MD  fluticasone (FLONASE) 50 MCG/ACT nasal spray Place 2 sprays into both nostrils daily. 01/22/18   Domenick Gong, MD  ibuprofen (ADVIL) 200 MG tablet Take 400-800 mg by mouth every 6 (six) hours as needed for fever, mild pain or moderate pain.    [provider]   lisinopril (ZESTRIL) 5 MG tablet 1 qd 11/22/19   Babs Sciara, MD  loperamide (IMODIUM) 2 MG capsule Take 2 capsules (4 mg total) by mouth every 8 (eight) hours as needed for diarrhea or loose stools. Patient not taking: Reported on 04/14/2019 01/23/19   Shaune Pollack, MD  Multiple Vitamins-Minerals (PRESERVISION AREDS) CAPS Take 1-2 capsules by mouth daily.    [provider]  Nutritional Supplements (JUICE PLUS FIBRE PO) Take 3 capsules by mouth at bedtime. 1- Veggies, 1- Fruit, 1-Berry    [provider]  Omega-3 Fatty Acids (FISH OIL) 1200 MG CAPS Take 1 capsule by mouth daily.     [provider]  omeprazole (PRILOSEC) 20 MG capsule Take 20 mg by mouth 2 (two) times a day.    [provider]  ondansetron (ZOFRAN ODT) 4 MG disintegrating tablet Take 1 tablet (4 mg total) by mouth every 8 (eight) hours as needed for nausea or vomiting. 01/09/20   Sharman Cheek, MD  ondansetron (ZOFRAN) 4 MG tablet Take 1 tablet (4 mg total) by mouth daily as needed. Patient not taking: Reported on 04/14/2019 01/19/19 01/19/20  Willy Eddy, MD  OVER THE COUNTER MEDICATION Claritin Flonase    [provider]  OVER THE COUNTER MEDICATION ZQuil    [provider]  rosuvastatin (CRESTOR) 40 MG tablet Take 1 tablet (40 mg total) by mouth daily. 11/22/19   Babs Sciara, MD  vitamin B-12 (CYANOCOBALAMIN) 1000 MCG tablet Take 1,000 mcg by mouth daily.    [provider]     Allergies Amoxicillin   Family History  Problem Relation Age of Onset  . Breast cancer Mother   . CAD Father   . Pulmonary fibrosis Father     Social History Social History   Tobacco Use  . Smoking status: Former Smoker    Quit date: 08/31/1984    Years since quitting: 35.3  . Smokeless tobacco: Former Neurosurgeon    Quit date: 02/05/1991  . Tobacco comment: only smoked for one year. quit in 1985. quit smokeless tobacco in  1992  Vaping Use  . Vaping Use: Never used   Substance Use Topics  . Alcohol use: Yes    Alcohol/week: 4.0 standard drinks    Types: 4 Cans of beer per week  . Drug use: Not on file    Review of Systems  Constitutional:   No fever positive chills.  ENT:   No sore throat. No rhinorrhea. Cardiovascular:   No chest pain or syncope. Respiratory:   No dyspnea or cough. Gastrointestinal: Positive abdominal pain and diarrhea as above.  No bloody stool Musculoskeletal:   Negative for focal pain or swelling All other systems reviewed and are negative except as documented above in ROS and HPI.  ____________________________________________   PHYSICAL EXAM:  VITAL SIGNS: ED Triage Vitals [01/09/20 1535]  Enc Vitals Group     BP (!) 182/103     Pulse Rate 97     Resp 16     Temp 98 F (36.7 C)     Temp Source Oral  SpO2 97 %     Weight (!) 315 lb (142.9 kg)     Height 6' (1.829 m)     Head Circumference      Peak Flow      Pain Score 2     Pain Loc      Pain Edu?      Excl. in GC?     Vital signs reviewed, nursing assessments reviewed.   Constitutional:   Alert and oriented. Non-toxic appearance. Eyes:   Conjunctivae are normal. EOMI. PERRL. ENT      Head:   Normocephalic and atraumatic.      Nose:   Wearing a mask.      Mouth/Throat:   Wearing a mask.      Neck:   No meningismus. Full ROM. Hematological/Lymphatic/Immunilogical:   No cervical lymphadenopathy. Cardiovascular:   RRR. Symmetric bilateral radial and DP pulses.  No murmurs. Cap refill less than 2 seconds. Respiratory:   Normal respiratory effort without tachypnea/retractions. Breath sounds are clear and equal bilaterally. No wheezes/rales/rhonchi. Gastrointestinal:   Soft with mild left lower quadrant tenderness. Non distended. There is no CVA tenderness.  No rebound, rigidity, or guarding.  Musculoskeletal:   Normal range of motion in all extremities. No joint effusions.  No lower extremity tenderness.  No edema. Neurologic:   Normal speech and  language.  Motor grossly intact. No acute focal neurologic deficits are appreciated.  Skin:    Skin is warm, dry and intact. No rash noted.  No petechiae, purpura, or bullae.  ____________________________________________    LABS (pertinent positives/negatives) (all labs ordered are listed, but only abnormal results are displayed) Labs Reviewed  COMPREHENSIVE METABOLIC PANEL - Abnormal; Notable for the following components:      Result Value   CO2 21 (*)    Glucose, Bld 130 (*)    All other components within normal limits  URINALYSIS, COMPLETE (UACMP) WITH MICROSCOPIC - Abnormal; Notable for the following components:   Color, Urine AMBER (*)    APPearance HAZY (*)    Hgb urine dipstick SMALL (*)    Ketones, ur 5 (*)    Protein, ur 30 (*)    All other components within normal limits  LIPASE, BLOOD  CBC   ____________________________________________   EKG    ____________________________________________    RADIOLOGY  No results found.  ____________________________________________   PROCEDURES Procedures  ____________________________________________    CLINICAL IMPRESSION / ASSESSMENT AND PLAN / ED COURSE  Medications ordered in the ED: Medications - No data to display  Pertinent labs & imaging results that were available during my care of the patient were reviewed by me and considered in my medical decision making (see chart for details).  Phillip Spencer was evaluated in Emergency Department on 01/09/2020 for the symptoms described in the history of present illness. He was evaluated in the context of the global COVID-19 pandemic, which necessitated consideration that the patient might be at risk for infection with the SARS-CoV-2 virus that causes COVID-19. Institutional protocols and algorithms that pertain to the evaluation of patients at risk for COVID-19 are in a state of rapid change based on information released by regulatory bodies including the CDC and  federal and state organizations. These policies and algorithms were followed during the patient's care in the ED.   Patient presents with abdominal pain and diarrhea for the past 2 days.  By history, most likely a infectious diarrhea from foodborne pathogen.  Labs are unremarkable and without evidence of acute  blood loss anemia, electrolyte abnormality, or metabolic acidosis  Currently patient's vital signs are unremarkable, he is nontoxic, no signs of severe illness.  Recommended conservative management initially with Pepcid and Zofran, but if symptoms worsen or fail to improve in the next 3 days, he should start taking ciprofloxacin.  Return precautions for inability to eat or drink, lightheadedness, severe pain, fever, or other new concerns.   Considering the patient's symptoms, medical history, and physical examination today, I have low suspicion for choledocholithiasis, pancreatitis, perforation or bowel obstruction, hernia, intra-abdominal abscess, AAA or dissection, volvulus or intussusception, mesenteric ischemia, or appendicitis.        ____________________________________________   FINAL CLINICAL IMPRESSION(S) / ED DIAGNOSES    Final diagnoses:  Generalized abdominal pain  Diarrhea of presumed infectious origin  Food poisoning     ED Discharge Orders         Ordered    famotidine (PEPCID) 20 MG tablet  2 times daily     Discontinue  Reprint     01/09/20 1947    ondansetron (ZOFRAN ODT) 4 MG disintegrating tablet  Every 8 hours PRN     Discontinue  Reprint     01/09/20 1947    ciprofloxacin (CIPRO) 500 MG tablet  2 times daily     Discontinue  Reprint     01/09/20 1947          Portions of this note were generated with dragon dictation software. Dictation errors may occur despite best attempts at proofreading.   Carrie Mew, MD 01/09/20 [phone]

## 2020-02-27 ENCOUNTER — Other Ambulatory Visit: Payer: Self-pay | Admitting: Family Medicine

## 2020-02-27 NOTE — Telephone Encounter (Signed)
May pend prescription so it is filled today Needs to schedule follow-up visit within 30 days

## 2020-02-27 NOTE — Telephone Encounter (Signed)
Contacted pt. Pt states when he was seen in May he was told to return in 6 months and plans on having physical done in December. He wants to make sure he needs to schedule this med check. Also wants med check to be virtual.

## 2020-02-28 ENCOUNTER — Telehealth: Payer: Self-pay | Admitting: Family Medicine

## 2020-02-28 NOTE — Telephone Encounter (Signed)
Hi Phillip Spencer There may have been some miscommunication.  I am sorry that Phillip Spencer felt that his next checkup would be in 6 months For controlled medications such as ADD medicines I can send in 3 prescriptions at the time of the visit often I will do a fourth prescription to get a patient by until the next visit but I cannot stretch it any further than that  So therefore he will need to do ADD follow-up in September he can still do his wellness exam in December thank you  It is fine for him to do the ADD follow-up in September virtual

## 2020-02-29 NOTE — Telephone Encounter (Signed)
FYI Pt is scheduled for 9/15.

## 2020-03-27 ENCOUNTER — Other Ambulatory Visit: Payer: Self-pay | Admitting: Family Medicine

## 2020-03-28 ENCOUNTER — Telehealth: Payer: PRIVATE HEALTH INSURANCE | Admitting: Family Medicine

## 2020-04-03 ENCOUNTER — Other Ambulatory Visit: Payer: Self-pay

## 2020-04-03 ENCOUNTER — Telehealth (INDEPENDENT_AMBULATORY_CARE_PROVIDER_SITE_OTHER): Payer: PRIVATE HEALTH INSURANCE | Admitting: Family Medicine

## 2020-04-03 ENCOUNTER — Telehealth: Payer: Self-pay | Admitting: *Deleted

## 2020-04-03 ENCOUNTER — Encounter: Payer: Self-pay | Admitting: Family Medicine

## 2020-04-03 DIAGNOSIS — R7303 Prediabetes: Secondary | ICD-10-CM | POA: Diagnosis not present

## 2020-04-03 DIAGNOSIS — Z79899 Other long term (current) drug therapy: Secondary | ICD-10-CM

## 2020-04-03 DIAGNOSIS — F909 Attention-deficit hyperactivity disorder, unspecified type: Secondary | ICD-10-CM

## 2020-04-03 DIAGNOSIS — Z125 Encounter for screening for malignant neoplasm of prostate: Secondary | ICD-10-CM

## 2020-04-03 DIAGNOSIS — E7849 Other hyperlipidemia: Secondary | ICD-10-CM

## 2020-04-03 MED ORDER — AMPHETAMINE-DEXTROAMPHET ER 10 MG PO CP24: ORAL_CAPSULE | ORAL | 0 refills | Status: DC

## 2020-04-03 NOTE — Progress Notes (Signed)
   Subjective:    Patient ID: Phillip Spencer, male    DOB: Aug 25, 1960, 59 y.o.   MRN: [id number]  HPI  Patient calls for a follow up on ADHD. Patient states he has been doing great on medication and it has been "life changing" and improved his quality of life. This patient has adult ADD. Takes medication responsibly. Medication does help the patient focus in be more functional. Patient relates that they are or not abusing the medication or misusing the medication. The patient understands that if they're having any negative side effects such as elevated high blood pressure severe headaches they would need stop the medication follow-up immediately. They also understand that the prescriptions are to last for 3 months then the patient will need to follow-up before having further prescriptions.  Patient compliance relates good compliance takes it every day helps some function  Does medication help patient function /attention better states his attention is better he gets more done less scattered in his thinking  Side effects denies any side effects   Virtual Visit via Video Note  I connected with Phillip Spencer on 04/03/20 at  2:00 PM EDT by a video enabled telemedicine application and verified that I am speaking with the correct person using two identifiers.  Location: Patient: home Provider: office   I discussed the limitations of evaluation and management by telemedicine and the availability of in person appointments. The patient expressed understanding and agreed to proceed.  History of Present Illness:    Observations/Objective:   Assessment and Plan:   Follow Up Instructions:    I discussed the assessment and treatment plan with the patient. The patient was provided an opportunity to ask questions and all were answered. The patient agreed with the plan and demonstrated an understanding of the instructions.   The patient was advised to call back or seek an in-person  evaluation if the symptoms worsen or if the condition fails to improve as anticipated.  I provided 20 minutes of non-face-to-face time during this encounter. Including documentation ordering medications   Review of Systems  Constitutional: Negative for activity change, appetite change and fatigue.  HENT: Negative for congestion and rhinorrhea.   Respiratory: Negative for cough and shortness of breath.   Cardiovascular: Negative for chest pain and leg swelling.  Gastrointestinal: Negative for abdominal pain, nausea and vomiting.  Neurological: Negative for dizziness and headaches.  Psychiatric/Behavioral: Negative for agitation and behavioral problems.       Objective:     Today's visit was via telephone Physical exam was not possible for this visit      Assessment & Plan:  1. Morbid obesity (HCC) Patient states he is following [facility] to help him lose weight  2. Attention deficit hyperactivity disorder (ADHD), unspecified ADHD type 3 scripts given Has adult ADD Benefits from the medication   3. Prediabetes Check labs for his wellness call following up in December - Hemoglobin A1c - Basic metabolic panel  4. Other hyperlipidemia Check labs for his wellness in December - Lipid panel  5. High risk medication use Liver function - Hepatic function panel  6. Screening PSA (prostate specific antigen) Check PSA - PSA

## 2020-04-03 NOTE — Telephone Encounter (Signed)
Mr. Phillip Spencer, Phillip Spencer are scheduled for a virtual visit with your provider today.    Just as we do with appointments in the office, we must obtain your consent to participate.  Your consent will be active for this visit and any virtual visit you may have with one of our providers in the next 365 days.    If you have a MyChart account, I can also send a copy of this consent to you electronically.  All virtual visits are billed to your insurance company just like a traditional visit in the office.  As this is a virtual visit, video technology does not allow for your provider to perform a traditional examination.  This may limit your provider's ability to fully assess your condition.  If your provider identifies any concerns that need to be evaluated in person or the need to arrange testing such as labs, EKG, etc, we will make arrangements to do so.    Although advances in technology are sophisticated, we cannot ensure that it will always work on either your end or our end.  If the connection with a video visit is poor, we may have to switch to a telephone visit.  With either a video or telephone visit, we are not always able to ensure that we have a secure connection.   I need to obtain your verbal consent now.   Are you willing to proceed with your visit today?   [name] has provided verbal consent on 04/03/2020 for a virtual visit (video or telephone).   Kathleen Lime, RN 04/03/2020  1:33 PM

## 2020-04-12 ENCOUNTER — Other Ambulatory Visit: Payer: Self-pay | Admitting: Family Medicine

## 2020-06-11 ENCOUNTER — Telehealth: Payer: Self-pay | Admitting: Family Medicine

## 2020-06-11 MED ORDER — CHOLESTYRAMINE LIGHT 4 GM/DOSE PO POWD: 4.0000 g | Freq: Every day | ORAL | 6 refills | Status: DC

## 2020-06-11 NOTE — Telephone Encounter (Signed)
Med sent, pt notified.  

## 2020-06-11 NOTE — Telephone Encounter (Signed)
Pt calling into office. Pt states that he just found out his GI doctor has retired. Pt is needing Cholestyramine (90 day supply) refilled. Pt also has Omeprazole 20 mg that is ordered through GI. Not needing that refilled at this time. Pt is wanting to know if Dr.Scott will refill the Cholestyramine until he can find a new GI. Please advise. Thank you  [facility]

## 2020-06-11 NOTE — Telephone Encounter (Signed)
I would be fine with refilling that times 6

## 2020-06-11 NOTE — Addendum Note (Signed)
Addended by: Haze Rushing on: 06/11/2020 12:59 PM   Modules accepted: Orders

## 2020-06-18 ENCOUNTER — Telehealth: Payer: Self-pay | Admitting: *Deleted

## 2020-06-18 NOTE — Telephone Encounter (Signed)
Pt called for bw orders for wellness on 12/16. Pt has active orders for lipid, liver, bmp, a1c and psa that were put in on 04/03/20. Do you want to add anything?

## 2020-06-18 NOTE — Telephone Encounter (Signed)
May utilize those

## 2020-06-18 NOTE — Telephone Encounter (Signed)
Lm letting pt know blood work orders are in the system.

## 2020-06-25 ENCOUNTER — Other Ambulatory Visit: Payer: Self-pay | Admitting: Family Medicine

## 2020-06-26 ENCOUNTER — Encounter: Payer: Self-pay | Admitting: Family Medicine

## 2020-06-26 LAB — HEPATIC FUNCTION PANEL
ALT: 28 IU/L (ref 0–44)
AST: 22 IU/L (ref 0–40)
Albumin: 4.3 g/dL (ref 3.8–4.9)
Alkaline Phosphatase: 103 IU/L (ref 44–121)
Bilirubin Total: 0.7 mg/dL (ref 0.0–1.2)
Bilirubin, Direct: 0.22 mg/dL (ref 0.00–0.40)
Total Protein: 6.5 g/dL (ref 6.0–8.5)

## 2020-06-26 LAB — BASIC METABOLIC PANEL
BUN/Creatinine Ratio: 17 (ref 9–20)
BUN: 12 mg/dL (ref 6–24)
CO2: 24 mmol/L (ref 20–29)
Calcium: 9.8 mg/dL (ref 8.7–10.2)
Chloride: 99 mmol/L (ref 96–106)
Creatinine, Ser: 0.71 mg/dL — ABNORMAL LOW (ref 0.76–1.27)
GFR calc Af Amer: 120 mL/min/{1.73_m2} (ref >59)
GFR calc non Af Amer: 103 mL/min/{1.73_m2} (ref >59)
Glucose: 102 mg/dL — ABNORMAL HIGH (ref 65–99)
Potassium: 4.4 mmol/L (ref 3.5–5.2)
Sodium: 138 mmol/L (ref 134–144)

## 2020-06-26 LAB — LIPID PANEL
Chol/HDL Ratio: 2.3 ratio (ref 0.0–5.0)
Cholesterol, Total: 187 mg/dL (ref 100–199)
HDL: 82 mg/dL (ref >39)
LDL Chol Calc (NIH): 90 mg/dL (ref 0–99)
Triglycerides: 85 mg/dL (ref 0–149)
VLDL Cholesterol Cal: 15 mg/dL (ref 5–40)

## 2020-06-26 LAB — HEMOGLOBIN A1C
Est. average glucose Bld gHb Est-mCnc: 123 mg/dL
Hgb A1c MFr Bld: 5.9 % — ABNORMAL HIGH (ref 4.8–5.6)

## 2020-06-26 LAB — PSA: Prostate Specific Ag, Serum: 0.2 ng/mL (ref 0.0–4.0)

## 2020-06-28 ENCOUNTER — Encounter: Payer: PRIVATE HEALTH INSURANCE | Admitting: Family Medicine

## 2020-07-10 ENCOUNTER — Encounter: Payer: Self-pay | Admitting: Family Medicine

## 2020-07-10 ENCOUNTER — Ambulatory Visit (INDEPENDENT_AMBULATORY_CARE_PROVIDER_SITE_OTHER): Payer: PRIVATE HEALTH INSURANCE | Admitting: Family Medicine

## 2020-07-10 VITALS — BP 124/84 | HR 74 | Temp 97.9°F | Ht 74.0 in | Wt 271.0 lb

## 2020-07-10 DIAGNOSIS — Z79899 Other long term (current) drug therapy: Secondary | ICD-10-CM | POA: Diagnosis not present

## 2020-07-10 DIAGNOSIS — I10 Essential (primary) hypertension: Secondary | ICD-10-CM

## 2020-07-10 DIAGNOSIS — Z0000 Encounter for general adult medical examination without abnormal findings: Secondary | ICD-10-CM

## 2020-07-10 DIAGNOSIS — J309 Allergic rhinitis, unspecified: Secondary | ICD-10-CM

## 2020-07-10 DIAGNOSIS — E7849 Other hyperlipidemia: Secondary | ICD-10-CM

## 2020-07-10 DIAGNOSIS — L659 Nonscarring hair loss, unspecified: Secondary | ICD-10-CM

## 2020-07-10 DIAGNOSIS — F909 Attention-deficit hyperactivity disorder, unspecified type: Secondary | ICD-10-CM

## 2020-07-10 MED ORDER — AMPHETAMINE-DEXTROAMPHET ER 10 MG PO CP24: ORAL_CAPSULE | ORAL | 0 refills | Status: DC

## 2020-07-10 MED ORDER — ROSUVASTATIN CALCIUM 40 MG PO TABS: ORAL_TABLET | ORAL | 1 refills | Status: DC

## 2020-07-10 MED ORDER — CHOLESTYRAMINE LIGHT 4 GM/DOSE PO POWD: 4.0000 g | Freq: Every day | ORAL | 6 refills | Status: DC

## 2020-07-10 MED ORDER — FLUTICASONE PROPIONATE 50 MCG/ACT NA SUSP: 2.0000 | Freq: Every day | NASAL | 2 refills | Status: DC

## 2020-07-10 MED ORDER — LISINOPRIL 5 MG PO TABS: ORAL_TABLET | ORAL | 1 refills | Status: DC

## 2020-07-10 MED ORDER — FINASTERIDE 5 MG PO TABS: 5.0000 mg | ORAL_TABLET | Freq: Every day | ORAL | 1 refills | Status: DC

## 2020-07-10 NOTE — Patient Instructions (Signed)
Preventive Care 41-58 Years Old, Male Preventive care refers to lifestyle choices and visits with your health care provider that can promote health and wellness. This includes:  A yearly physical exam. This is also called an annual well check.  Regular dental and eye exams.  Immunizations.  Screening for certain conditions.  Healthy lifestyle choices, such as eating a healthy diet, getting regular exercise, not using drugs or products that contain nicotine and tobacco, and limiting alcohol use. What can I expect for my preventive care visit? Physical exam Your health care provider will check:  Height and weight. These may be used to calculate body mass index (BMI), which is a measurement that tells if you are at a healthy weight.  Heart rate and blood pressure.  Your skin for abnormal spots. Counseling Your health care provider may ask you questions about:  Alcohol, tobacco, and drug use.  Emotional well-being.  Home and relationship well-being.  Sexual activity.  Eating habits.  Work and work Statistician. What immunizations do I need?  Influenza (flu) vaccine  This is recommended every year. Tetanus, diphtheria, and pertussis (Tdap) vaccine  You may need a Td booster every 10 years. Varicella (chickenpox) vaccine  You may need this vaccine if you have not already been vaccinated. Zoster (shingles) vaccine  You may need this after age 64. Measles, mumps, and rubella (MMR) vaccine  You may need at least one dose of MMR if you were born in 1957 or later. You may also need a second dose. Pneumococcal conjugate (PCV13) vaccine  You may need this if you have certain conditions and were not previously vaccinated. Pneumococcal polysaccharide (PPSV23) vaccine  You may need one or two doses if you smoke cigarettes or if you have certain conditions. Meningococcal conjugate (MenACWY) vaccine  You may need this if you have certain conditions. Hepatitis A  vaccine  You may need this if you have certain conditions or if you travel or work in places where you may be exposed to hepatitis A. Hepatitis B vaccine  You may need this if you have certain conditions or if you travel or work in places where you may be exposed to hepatitis B. Haemophilus influenzae type b (Hib) vaccine  You may need this if you have certain risk factors. Human papillomavirus (HPV) vaccine  If recommended by your health care provider, you may need three doses over 6 months. You may receive vaccines as individual doses or as more than one vaccine together in one shot (combination vaccines). Talk with your health care provider about the risks and benefits of combination vaccines. What tests do I need? Blood tests  Lipid and cholesterol levels. These may be checked every 5 years, or more frequently if you are over 60 years old.  Hepatitis C test.  Hepatitis B test. Screening  Lung cancer screening. You may have this screening every year starting at age 43 if you have a 30-pack-year history of smoking and currently smoke or have quit within the past 15 years.  Prostate cancer screening. Recommendations will vary depending on your family history and other risks.  Colorectal cancer screening. All adults should have this screening starting at age 72 and continuing until age 2. Your health care provider may recommend screening at age 14 if you are at increased risk. You will have tests every 1-10 years, depending on your results and the type of screening test.  Diabetes screening. This is done by checking your blood sugar (glucose) after you have not eaten  for a while (fasting). You may have this done every 1-3 years.  Sexually transmitted disease (STD) testing. Follow these instructions at home: Eating and drinking  Eat a diet that includes fresh fruits and vegetables, whole grains, lean protein, and low-fat dairy products.  Take vitamin and mineral supplements as  recommended by your health care provider.  Do not drink alcohol if your health care provider tells you not to drink.  If you drink alcohol: ? Limit how much you have to 0-2 drinks a day. ? Be aware of how much alcohol is in your drink. In the [place] one drink equals one 12 oz bottle of beer (355 mL), one 5 oz glass of wine (148 mL), or one 1 oz glass of hard liquor (44 mL). Lifestyle  Take daily care of your teeth and gums.  Stay active. Exercise for at least 30 minutes on 5 or more days each week.  Do not use any products that contain nicotine or tobacco, such as cigarettes, e-cigarettes, and chewing tobacco. If you need help quitting, ask your health care provider.  If you are sexually active, practice safe sex. Use a condom or other form of protection to prevent STIs (sexually transmitted infections).  Talk with your health care provider about taking a low-dose aspirin every day starting at age 17. What's next?  Go to your health care provider once a year for a well check visit.  Ask your health care provider how often you should have your eyes and teeth checked.  Stay up to date on all vaccines. This information is not intended to replace advice given to you by your health care provider. Make sure you discuss any questions you have with your health care provider. Document Revised: 06/24/2018 Document Reviewed: 06/24/2018 Elsevier Patient Education  2020 [employer].

## 2020-07-10 NOTE — Progress Notes (Signed)
Patient ID: Phillip Spencer, male    DOB: [birth year], 59 y.o.   MRN: [id number]   Chief Complaint  Patient presents with  . Annual Exam   Subjective:  CC: annual wellness   Presents today for annual wellness.  Reports that he has made excellent progress with a diet program called [employer].  He has lost 57 pounds to date, with the goal of 90 more to go.  He has no concerns for his health, his goal is 140 pound weight loss and to get off of his medications for his chronic conditions.  Chronic conditions include ADD, essential hypertension, hyperlipidemia, alopecia, prediabetes.  No other health concerns, he is extremely motivated to improve his health and to get off all medications.  He denies fever, chills, chest pain, shortness of breath, abdominal pain, urinary symptoms, and swelling of his lower extremities.    The patient comes in today for a wellness visit.    A review of their health history was completed.  A review of medications was also completed.  Any needed refills; update all meds  Eating habits: health conscious  Falls/  MVA accidents in past few months: none  Regular exercise: yard work and house work  [employer] pt sees on regular basis: dr Earlean Polka - recently retired for colonoscopy and endoscopy  Preventative health issues were discussed.   Additional concerns: cholesterol and high blood pressure medicine   Medical History Phillip Spencer has a past medical history of GERD (gastroesophageal reflux disease), Hematuria, Hyperlipidemia, Hypertension, Pre-diabetes, and Renal calculus, right.   Outpatient Encounter Medications as of 07/10/2020  Medication Sig  . aspirin EC 81 MG tablet Take 81 mg by mouth daily.  . Coenzyme Q10 (CO Q-10) 200 MG CAPS Take 200 mg by mouth daily.   Marland Kitchen ibuprofen (ADVIL) 200 MG tablet Take 400-800 mg by mouth every 6 (six) hours as needed for fever, mild pain or moderate pain.  . Multiple Vitamins-Minerals (PRESERVISION AREDS) CAPS Take  1-2 capsules by mouth daily.  . Nutritional Supplements (JUICE PLUS FIBRE PO) Take 3 capsules by mouth at bedtime. 1- Veggies, 1- Fruit, 1-Berry  . omeprazole (PRILOSEC) 20 MG capsule Take 20 mg by mouth 2 (two) times a day.  Marland Kitchen OVER THE COUNTER MEDICATION Claritin Flonase  . OVER THE COUNTER MEDICATION ZQuil  . vitamin B-12 (CYANOCOBALAMIN) 1000 MCG tablet Take 1,000 mcg by mouth daily.  . [DISCONTINUED] amphetamine-dextroamphetamine (ADDERALL XR) 10 MG 24 hr capsule TAKE (1) CAPSULE BY MOUTH EVERY DAY  . [DISCONTINUED] amphetamine-dextroamphetamine (ADDERALL XR) 10 MG 24 hr capsule TAKE (1) CAPSULE BY MOUTH EVERY DAY  . [DISCONTINUED] amphetamine-dextroamphetamine (ADDERALL XR) 10 MG 24 hr capsule Take one capsule po every day  . [DISCONTINUED] amphetamine-dextroamphetamine (ADDERALL XR) 10 MG 24 hr capsule TAKE (1) CAPSULE BY MOUTH EVERY DAY  . [DISCONTINUED] cholestyramine light (PREVALITE) 4 GM/DOSE powder Take 1 packet (4 g total) by mouth at bedtime.  . [DISCONTINUED] famotidine (PEPCID) 20 MG tablet Take 1 tablet (20 mg total) by mouth 2 (two) times daily.  . [DISCONTINUED] finasteride (PROSCAR) 5 MG tablet Take 1 tablet (5 mg total) by mouth daily.  . [DISCONTINUED] fluticasone (FLONASE) 50 MCG/ACT nasal spray Place 2 sprays into both nostrils daily.  . [DISCONTINUED] lisinopril (ZESTRIL) 5 MG tablet TAKE (1) TABLET BY MOUTH EVERY DAY  . [DISCONTINUED] Omega-3 Fatty Acids (FISH OIL) 1200 MG CAPS Take 1 capsule by mouth daily.   . [DISCONTINUED] rosuvastatin (CRESTOR) 40 MG tablet TAKE (1) TABLET BY MOUTH DAILY.  Marland Kitchen  amphetamine-dextroamphetamine (ADDERALL XR) 10 MG 24 hr capsule TAKE (1) CAPSULE BY MOUTH EVERY DAY  . amphetamine-dextroamphetamine (ADDERALL XR) 10 MG 24 hr capsule TAKE (1) CAPSULE BY MOUTH EVERY DAY  . amphetamine-dextroamphetamine (ADDERALL XR) 10 MG 24 hr capsule Take one capsule po every day  . amphetamine-dextroamphetamine (ADDERALL XR) 10 MG 24 hr capsule TAKE (1)  CAPSULE BY MOUTH EVERY DAY  . cholestyramine light (PREVALITE) 4 GM/DOSE powder Take 1 packet (4 g total) by mouth at bedtime.  . finasteride (PROSCAR) 5 MG tablet Take 1 tablet (5 mg total) by mouth daily.  . fluticasone (FLONASE) 50 MCG/ACT nasal spray Place 2 sprays into both nostrils daily.  Marland Kitchen lisinopril (ZESTRIL) 5 MG tablet TAKE (1) TABLET BY MOUTH EVERY DAY  . rosuvastatin (CRESTOR) 40 MG tablet TAKE (1) TABLET BY MOUTH DAILY.  . [DISCONTINUED] loperamide (IMODIUM) 2 MG capsule Take 2 capsules (4 mg total) by mouth every 8 (eight) hours as needed for diarrhea or loose stools. (Patient not taking: Reported on 04/14/2019)  . [DISCONTINUED] ondansetron (ZOFRAN ODT) 4 MG disintegrating tablet Take 1 tablet (4 mg total) by mouth every 8 (eight) hours as needed for nausea or vomiting.   No facility-administered encounter medications on file as of 07/10/2020.     Review of Systems  Constitutional: Negative for chills and fever.  HENT: Negative for ear pain.   Respiratory: Negative for shortness of breath.   Cardiovascular: Negative for chest pain.  Gastrointestinal: Negative for abdominal pain.  Genitourinary: Negative for dysuria.     Vitals BP 124/84   Pulse 74   Temp 97.9 F (36.6 C)   Ht 6\' 2"  (1.88 m)   Wt 271 lb (122.9 kg)   SpO2 97%   BMI 34.79 kg/m   Objective:   Physical Exam Vitals reviewed.  HENT:     Right Ear: Tympanic membrane normal.     Left Ear: Tympanic membrane normal.     Nose: Nose normal.     Mouth/Throat:     Mouth: Mucous membranes are moist.     Pharynx: Oropharynx is clear.  Eyes:     Extraocular Movements: Extraocular movements intact.     Pupils: Pupils are equal, round, and reactive to light.  Cardiovascular:     Rate and Rhythm: Normal rate and regular rhythm.     Heart sounds: Normal heart sounds.  Pulmonary:     Effort: Pulmonary effort is normal.     Breath sounds: Normal breath sounds.  Abdominal:     General: Bowel sounds are  normal.     Tenderness: There is no abdominal tenderness.  Genitourinary:    Comments: DRE deferred. PSA levels are consistently low, shared decision making. No problems voiced.  Skin:    General: Skin is warm and dry.  Neurological:     General: No focal deficit present.     Mental Status: He is alert.  Psychiatric:        Behavior: Behavior normal.      Assessment and Plan   1. Routine general medical examination at a health care facility  2. Medication management - Ambulatory referral to Gastroenterology  3. Essential hypertension - lisinopril (ZESTRIL) 5 MG tablet; TAKE (1) TABLET BY MOUTH EVERY DAY  Dispense: 90 tablet; Refill: 1  4. Other hyperlipidemia - cholestyramine light (PREVALITE) 4 GM/DOSE powder; Take 1 packet (4 g total) by mouth at bedtime.  Dispense: 239.4 g; Refill: 6 - rosuvastatin (CRESTOR) 40 MG tablet; TAKE (1) TABLET BY MOUTH  DAILY.  Dispense: 90 tablet; Refill: 1  5. Alopecia - finasteride (PROSCAR) 5 MG tablet; Take 1 tablet (5 mg total) by mouth daily.  Dispense: 90 tablet; Refill: 1  6. Attention deficit hyperactivity disorder (ADHD), unspecified ADHD type - amphetamine-dextroamphetamine (ADDERALL XR) 10 MG 24 hr capsule; TAKE (1) CAPSULE BY MOUTH EVERY DAY  Dispense: 30 capsule; Refill: 0 - amphetamine-dextroamphetamine (ADDERALL XR) 10 MG 24 hr capsule; TAKE (1) CAPSULE BY MOUTH EVERY DAY  Dispense: 30 capsule; Refill: 0 - amphetamine-dextroamphetamine (ADDERALL XR) 10 MG 24 hr capsule; Take one capsule po every day  Dispense: 30 capsule; Refill: 0 - amphetamine-dextroamphetamine (ADDERALL XR) 10 MG 24 hr capsule; TAKE (1) CAPSULE BY MOUTH EVERY DAY  Dispense: 30 capsule; Refill: 0  7. Allergic rhinitis, unspecified seasonality, unspecified trigger - fluticasone (FLONASE) 50 MCG/ACT nasal spray; Place 2 sprays into both nostrils daily.  Dispense: 16 g; Refill: 2   Lab work reviewed in detail, he has made excellent progress cholesterol levels are  much improved and all within normal limits.  Blood pressure is excellent 124/84 colonoscopy up-to-date, due for his next one in 2023.  He does need a new GI specialist, his has retired.  We will make that referral today-he is on Cholestyramine and received this through GI in the past.   ADD: He is on Adderall XR, has been on this for the past 3 to 4 years reports that it helps him to stay focused and he is not stressed out with his mind racing all over the place.  He is compliant on this medication, will continue current regimen. He tolerates this well, controlled substance registry checked.  Essential hypertension: He is on lisinopril, tolerates this well blood pressure well managed, working on lifestyle modifications currently, has had excellent weight loss we will continue current regimen.  Hyperlipidemia: Compliant with statin therapy, cholesterol excellent control, wishes to get off of this medication in the future, making wonderful lifestyle changes, excellent weight loss, will continue current medication.  Alopecia: Taking finasteride for this, tolerating well, will continue with current regimen.  Prediabetes: Glucose much improved on lab work, weight loss, continuing with lifestyle changes.  Health maintenance: Colonoscopy due 2023, GI specialist retired, will make referral for new specialist.  Agrees with plan of care discussed today. Understands warning signs to seek further care: Chest pain, shortness of breath, any significant change in health. Understands to follow-up in 3 months, per patient request, hoping to get off of some medications at that time.  Weight loss goal is to lose 140 pounds, he has lost 57 so far. Congratulations on the excellent progress.   Dorena Bodo, FNP-C 07/10/2020

## 2020-07-17 ENCOUNTER — Telehealth: Payer: Self-pay | Admitting: Family Medicine

## 2020-07-23 ENCOUNTER — Telehealth: Payer: Self-pay | Admitting: Family Medicine

## 2020-07-30 ENCOUNTER — Telehealth: Payer: Self-pay | Admitting: Family Medicine

## 2020-09-18 ENCOUNTER — Telehealth: Payer: Self-pay

## 2020-09-18 ENCOUNTER — Other Ambulatory Visit: Payer: Self-pay | Admitting: *Deleted

## 2020-09-18 DIAGNOSIS — K219 Gastro-esophageal reflux disease without esophagitis: Secondary | ICD-10-CM

## 2020-09-18 MED ORDER — OMEPRAZOLE 20 MG PO CPDR: 20.0000 mg | DELAYED_RELEASE_CAPSULE | Freq: Two times a day (BID) | ORAL | 0 refills | Status: DC

## 2020-09-18 NOTE — Telephone Encounter (Signed)
Pt returned call and verbalized understanding  

## 2020-09-18 NOTE — Telephone Encounter (Signed)
Need Blood work ordered for 03/28 appt   Pt call back [phone]

## 2020-09-18 NOTE — Telephone Encounter (Signed)
Pt had gastro Dr that retired and he is out of him meds until he can get into another gastro dr needs refill on omeprazole (PRILOSEC) 20 MG capsule [facility] - [place], [place] - [address]   Pt needs a referral for gastro dr he would like Dr Rolene Arbour [phone] [facility]

## 2020-09-18 NOTE — Telephone Encounter (Signed)
May have 90-day with refill on his omeprazole May also have a referral as requested

## 2020-09-18 NOTE — Telephone Encounter (Signed)
Referral put in and refill sent to pharm. Left message to return call

## 2020-09-28 ENCOUNTER — Telehealth: Payer: Self-pay

## 2020-09-28 NOTE — Telephone Encounter (Signed)
Please advise. Thank you

## 2020-09-28 NOTE — Telephone Encounter (Signed)
Pt was wrote Rx on omeprazole (PRILOSEC) 20 MG capsule  For twice a day but insurance will only cover 1 a day. He was saying that the GI doctor wrote a 90 day supply he would take twice a day and when the Dr would write again Insurance will cover.   Pt call back [phone]

## 2020-10-01 MED ORDER — OMEPRAZOLE 20 MG PO CPDR: 20.0000 mg | DELAYED_RELEASE_CAPSULE | Freq: Two times a day (BID) | ORAL | 1 refills | Status: DC

## 2020-10-01 NOTE — Telephone Encounter (Signed)
Please resubmit as 1 taken twice daily, omeprazole 20 mg, #188, 1 refill additional If it gets her prior approval kickback please let me know and we will address thank you

## 2020-10-01 NOTE — Telephone Encounter (Signed)
Omeprazole 20 mg sent to [facility] (as requested by pt) and pt is aware

## 2020-10-04 ENCOUNTER — Telehealth: Payer: Self-pay

## 2020-10-04 DIAGNOSIS — I10 Essential (primary) hypertension: Secondary | ICD-10-CM

## 2020-10-04 DIAGNOSIS — Z79899 Other long term (current) drug therapy: Secondary | ICD-10-CM

## 2020-10-04 DIAGNOSIS — E7849 Other hyperlipidemia: Secondary | ICD-10-CM

## 2020-10-04 DIAGNOSIS — R7303 Prediabetes: Secondary | ICD-10-CM

## 2020-10-04 NOTE — Telephone Encounter (Signed)
Lab orders placed and pt is aware 

## 2020-10-04 NOTE — Telephone Encounter (Signed)
Last labs completed 07/13/20 BMET, Hepatic, Lipid, PSA and A1C. Please advise. Thank you

## 2020-10-04 NOTE — Telephone Encounter (Signed)
Pt has appt on Monday for follow up and needs blood work ordered.   Pt call back [phone]

## 2020-10-04 NOTE — Telephone Encounter (Signed)
A1c cmp lipid

## 2020-10-08 ENCOUNTER — Ambulatory Visit: Payer: PRIVATE HEALTH INSURANCE | Admitting: Family Medicine

## 2020-11-10 LAB — COMPREHENSIVE METABOLIC PANEL
ALT: 28 IU/L (ref 0–44)
AST: 28 IU/L (ref 0–40)
Albumin/Globulin Ratio: 1.7 (ref 1.2–2.2)
Albumin: 4.4 g/dL (ref 3.8–4.9)
Alkaline Phosphatase: 101 IU/L (ref 44–121)
BUN/Creatinine Ratio: 14 (ref 9–20)
BUN: 11 mg/dL (ref 6–24)
Bilirubin Total: 0.6 mg/dL (ref 0.0–1.2)
CO2: 28 mmol/L (ref 20–29)
Calcium: 9.4 mg/dL (ref 8.7–10.2)
Chloride: 100 mmol/L (ref 96–106)
Creatinine, Ser: 0.77 mg/dL (ref 0.76–1.27)
Globulin, Total: 2.6 g/dL (ref 1.5–4.5)
Glucose: 101 mg/dL — ABNORMAL HIGH (ref 65–99)
Potassium: 5.1 mmol/L (ref 3.5–5.2)
Sodium: 139 mmol/L (ref 134–144)
Total Protein: 7 g/dL (ref 6.0–8.5)
eGFR: 103 mL/min/{1.73_m2} (ref >59)

## 2020-11-10 LAB — LIPID PANEL
Chol/HDL Ratio: 2.2 ratio (ref 0.0–5.0)
Cholesterol, Total: 190 mg/dL (ref 100–199)
HDL: 88 mg/dL (ref >39)
LDL Chol Calc (NIH): 87 mg/dL (ref 0–99)
Triglycerides: 85 mg/dL (ref 0–149)
VLDL Cholesterol Cal: 15 mg/dL (ref 5–40)

## 2020-11-10 LAB — HEMOGLOBIN A1C
Est. average glucose Bld gHb Est-mCnc: 128 mg/dL
Hgb A1c MFr Bld: 6.1 % — ABNORMAL HIGH (ref 4.8–5.6)

## 2020-11-13 ENCOUNTER — Ambulatory Visit (INDEPENDENT_AMBULATORY_CARE_PROVIDER_SITE_OTHER): Payer: PRIVATE HEALTH INSURANCE | Admitting: Family Medicine

## 2020-11-13 ENCOUNTER — Other Ambulatory Visit: Payer: Self-pay

## 2020-11-13 ENCOUNTER — Encounter: Payer: Self-pay | Admitting: Family Medicine

## 2020-11-13 VITALS — BP 130/86 | HR 67 | Temp 97.3°F | Ht 74.0 in | Wt 259.2 lb

## 2020-11-13 DIAGNOSIS — R7303 Prediabetes: Secondary | ICD-10-CM | POA: Diagnosis not present

## 2020-11-13 DIAGNOSIS — F909 Attention-deficit hyperactivity disorder, unspecified type: Secondary | ICD-10-CM | POA: Diagnosis not present

## 2020-11-13 DIAGNOSIS — E7849 Other hyperlipidemia: Secondary | ICD-10-CM | POA: Diagnosis not present

## 2020-11-13 DIAGNOSIS — I10 Essential (primary) hypertension: Secondary | ICD-10-CM | POA: Diagnosis not present

## 2020-11-13 MED ORDER — AMPHETAMINE-DEXTROAMPHET ER 10 MG PO CP24: ORAL_CAPSULE | ORAL | 0 refills | Status: DC

## 2020-11-13 MED ORDER — ROSUVASTATIN CALCIUM 40 MG PO TABS: ORAL_TABLET | ORAL | 1 refills | Status: DC

## 2020-11-13 MED ORDER — LISINOPRIL 5 MG PO TABS: ORAL_TABLET | ORAL | 1 refills | Status: DC

## 2020-11-13 NOTE — Progress Notes (Signed)
Subjective:    Patient ID: Phillip Spencer, male    DOB: 07/04/1961, 60 y.o.   MRN: [id number]  Hypertension This is a chronic problem. The current episode started more than 1 year ago. Risk factors for coronary artery disease include dyslipidemia. Treatments tried: lisinopril. There are no compliance problems.    Would like to discuss coming off meds due to weight loss Patient states he has lost weight and he is interested in coming down or off of medications if possible  Attention deficit hyperactivity disorder (ADHD), unspecified ADHD type - Plan: amphetamine-dextroamphetamine (ADDERALL XR) 10 MG 24 hr capsule, amphetamine-dextroamphetamine (ADDERALL XR) 10 MG 24 hr capsule, amphetamine-dextroamphetamine (ADDERALL XR) 10 MG 24 hr capsule  Other hyperlipidemia - Plan: Lipid panel  Essential hypertension - Plan: Basic metabolic panel  Prediabetes - Plan: Basic metabolic panel, Hemoglobin A1c  Patient does take his blood pressure medicine regular basis watches salt intake Takes his cholesterol medicine watches diet ADD medicine helps him stay focused and benefits from it would like to continue it  This patient has adult ADD. Takes medication responsibly. Medication does help the patient focus in be more functional. Patient relates that they are or not abusing the medication or misusing the medication. The patient understands that if they're having any negative side effects such as elevated high blood pressure severe headaches they would need stop the medication follow-up immediately. They also understand that the prescriptions are to last for 3 months then the patient will need to follow-up before having further prescriptions.  Patient compliance good compliance  Does medication help patient function /attention better states the medication does help him stay focused and on track  Side effects denies side effects.   Review of Systems     Objective:   Physical Exam Vitals  reviewed.  Constitutional:      General: He is not in acute distress. HENT:     Head: Normocephalic and atraumatic.  Eyes:     General:        Right eye: No discharge.        Left eye: No discharge.  Neck:     Trachea: No tracheal deviation.  Cardiovascular:     Rate and Rhythm: Normal rate and regular rhythm.     Heart sounds: Normal heart sounds. No murmur heard.   Pulmonary:     Effort: Pulmonary effort is normal. No respiratory distress.     Breath sounds: Normal breath sounds.  Lymphadenopathy:     Cervical: No cervical adenopathy.  Skin:    General: Skin is warm and dry.  Neurological:     Mental Status: He is alert.     Coordination: Coordination normal.  Psychiatric:        Behavior: Behavior normal.           Assessment & Plan:  1. Attention deficit hyperactivity disorder (ADHD), unspecified ADHD type Patient does well with his medicine refills were sent in he is to follow-up in 3 months - amphetamine-dextroamphetamine (ADDERALL XR) 10 MG 24 hr capsule; TAKE (1) CAPSULE BY MOUTH EVERY DAY  Dispense: 30 capsule; Refill: 0 - amphetamine-dextroamphetamine (ADDERALL XR) 10 MG 24 hr capsule; TAKE (1) CAPSULE BY MOUTH EVERY DAY  Dispense: 30 capsule; Refill: 0 - amphetamine-dextroamphetamine (ADDERALL XR) 10 MG 24 hr capsule; Take one capsule po every day  Dispense: 30 capsule; Refill: 0  2. Other hyperlipidemia Reduce statin to 20 mg patient was interested in coming off I do not believe that is in  his best interest.  He agrees to stick with medication.  The importance of keeping cholesterol under good control discussed recheck before next visit - Lipid panel  3. Essential hypertension Blood pressure good control currently but I would not recommend coming off of medication watch diet stay active - Basic metabolic panel  4. Prediabetes Check A1c before next visit watch starches continue the diet he is on - Basic metabolic panel - Hemoglobin A1c Patient is done  a fantastic job losing weight he will continue to try to work hard at that

## 2020-11-13 NOTE — Patient Instructions (Addendum)
Shingrix and shingles prevention: know the facts!   Shingrix is a very effective vaccine to prevent shingles.   Shingles is a reactivation of chickenpox -more than 99% of Americans born before 1980 have had chickenpox even if they do not remember it. One in every 10 people who get shingles have severe long-lasting nerve pain as a result.   33 out of a 100 older adults will get shingles if they are unvaccinated.     This vaccine is very important for your health This vaccine is indicated for anyone 50 years or older. You can get this vaccine even if you have already had shingles because you can get the disease more than once in a lifetime.  Your risk for shingles and its complications increases with age.  This vaccine has 2 doses.  The second dose would be 2 to 6 months after the first dose.  If you had Zostavax vaccine in the past you should still get Shingrix. ( Zostavax is only 70% effective and it loses significant strength over a few years .)  This vaccine is given through the pharmacy.  The cost of the vaccine is through your insurance. The pharmacy can inform you of the total costs.  Common side effects including soreness in the arm, some redness and swelling, also some feel fatigue muscle soreness headache low-grade fever.  Side effects typically go away within 2 to 3 days. Remember-the pain from shingles can last a lifetime but these side effects of the vaccine will only last a few days at most. It is very important to get both doses in order to protect yourself fully.   Please get this vaccine at your earliest convenience at your trusted pharmacy.  Results for orders placed or performed in visit on 10/04/20  Hemoglobin A1c  Result Value Ref Range   Hgb A1c MFr Bld 6.1 (H) 4.8 - 5.6 %   Est. average glucose Bld gHb Est-mCnc 128 mg/dL  Comprehensive Metabolic Panel (CMET)  Result Value Ref Range   Glucose 101 (H) 65 - 99 mg/dL   BUN 11 6 - 24 mg/dL   Creatinine, Ser 0.77  0.76 - 1.27 mg/dL   eGFR 103 >59 mL/min/1.73   BUN/Creatinine Ratio 14 9 - 20   Sodium 139 134 - 144 mmol/L   Potassium 5.1 3.5 - 5.2 mmol/L   Chloride 100 96 - 106 mmol/L   CO2 28 20 - 29 mmol/L   Calcium 9.4 8.7 - 10.2 mg/dL   Total Protein 7.0 6.0 - 8.5 g/dL   Albumin 4.4 3.8 - 4.9 g/dL   Globulin, Total 2.6 1.5 - 4.5 g/dL   Albumin/Globulin Ratio 1.7 1.2 - 2.2   Bilirubin Total 0.6 0.0 - 1.2 mg/dL   Alkaline Phosphatase 101 44 - 121 IU/L   AST 28 0 - 40 IU/L   ALT 28 0 - 44 IU/L  Lipid Profile  Result Value Ref Range   Cholesterol, Total 190 100 - 199 mg/dL   Triglycerides 85 0 - 149 mg/dL   HDL 88 >39 mg/dL   VLDL Cholesterol Cal 15 5 - 40 mg/dL   LDL Chol Calc (NIH) 87 0 - 99 mg/dL   Chol/HDL Ratio 2.2 0.0 - 5.0 ratio

## 2020-12-08 ENCOUNTER — Other Ambulatory Visit: Payer: Self-pay | Admitting: Family Medicine

## 2020-12-08 DIAGNOSIS — E7849 Other hyperlipidemia: Secondary | ICD-10-CM

## 2020-12-11 NOTE — Telephone Encounter (Signed)
Please confirm he is taking this medicine then it would be fine to give 90 days with 1 refill

## 2021-03-04 ENCOUNTER — Other Ambulatory Visit: Payer: Self-pay | Admitting: Family Medicine

## 2021-03-04 DIAGNOSIS — F909 Attention-deficit hyperactivity disorder, unspecified type: Secondary | ICD-10-CM

## 2021-03-13 LAB — BASIC METABOLIC PANEL
BUN/Creatinine Ratio: 19 (ref 9–20)
BUN: 14 mg/dL (ref 6–24)
CO2: 24 mmol/L (ref 20–29)
Calcium: 9.7 mg/dL (ref 8.7–10.2)
Chloride: 97 mmol/L (ref 96–106)
Creatinine, Ser: 0.75 mg/dL — ABNORMAL LOW (ref 0.76–1.27)
Glucose: 102 mg/dL — ABNORMAL HIGH (ref 65–99)
Potassium: 5.1 mmol/L (ref 3.5–5.2)
Sodium: 137 mmol/L (ref 134–144)
eGFR: 104 mL/min/{1.73_m2} (ref >59)

## 2021-03-13 LAB — LIPID PANEL
Chol/HDL Ratio: 3.4 ratio (ref 0.0–5.0)
Cholesterol, Total: 220 mg/dL — ABNORMAL HIGH (ref 100–199)
HDL: 65 mg/dL (ref >39)
LDL Chol Calc (NIH): 82 mg/dL (ref 0–99)
Triglycerides: 457 mg/dL — ABNORMAL HIGH (ref 0–149)
VLDL Cholesterol Cal: 73 mg/dL — ABNORMAL HIGH (ref 5–40)

## 2021-03-13 LAB — HEMOGLOBIN A1C
Est. average glucose Bld gHb Est-mCnc: 114 mg/dL
Hgb A1c MFr Bld: 5.6 % (ref 4.8–5.6)

## 2021-03-14 ENCOUNTER — Ambulatory Visit (INDEPENDENT_AMBULATORY_CARE_PROVIDER_SITE_OTHER): Payer: No Typology Code available for payment source | Admitting: Family Medicine

## 2021-03-14 ENCOUNTER — Other Ambulatory Visit: Payer: Self-pay

## 2021-03-14 VITALS — BP 152/91 | Temp 97.2°F | Wt 256.8 lb

## 2021-03-14 DIAGNOSIS — I10 Essential (primary) hypertension: Secondary | ICD-10-CM

## 2021-03-14 DIAGNOSIS — F909 Attention-deficit hyperactivity disorder, unspecified type: Secondary | ICD-10-CM | POA: Diagnosis not present

## 2021-03-14 DIAGNOSIS — E781 Pure hyperglyceridemia: Secondary | ICD-10-CM

## 2021-03-14 MED ORDER — AMPHETAMINE-DEXTROAMPHET ER 10 MG PO CP24: ORAL_CAPSULE | ORAL | 0 refills | Status: DC

## 2021-03-14 MED ORDER — LISINOPRIL 10 MG PO TABS: ORAL_TABLET | ORAL | 5 refills | Status: DC

## 2021-03-14 NOTE — Patient Instructions (Addendum)
High Triglycerides Eating Plan Triglycerides are a type of fat in the blood. High levels of triglycerides can increase your risk of heart disease and stroke. If your triglyceride levels are high, choosing the right foods can help lower your triglycerides and keep your heart healthy. Work with your health care provider or a diet and nutrition specialist (dietitian) to develop an eating plan that is right for you. What are tips for following this plan? General guidelines  Lose weight, if you are overweight. For most people, losing 5-10 lbs (2-5 kg) helps lower triglyceride levels. A weight-loss plan may include. 30 minutes of exercise at least 5 days a week. Reducing the amount of calories, sugar, and fat you eat. Eat a wide variety of fresh fruits, vegetables, and whole grains. These foods are high in fiber. Eat foods that contain healthy fats, such as fatty fish, nuts, seeds, and olive oil. Avoid foods that are high in added sugar, added salt (sodium), saturated fat, and trans fat. Avoid low-fiber, refined carbohydrates such as white bread, crackers, noodles, and white rice. Avoid foods with partially hydrogenated oils (trans fats), such as fried foods or stick margarine. Limit alcohol intake to no more than 1 drink a day for nonpregnant women and 2 drinks a day for men. One drink equals 12 oz of beer, 5 oz of wine, or 1 oz of hard liquor. Your health care provider may recommend that you drink less depending on your overall health. Reading food labels Check food labels for the amount of saturated fat. Choose foods with no or very little saturated fat. Check food labels for the amount of trans fat. Choose foods with no trans fat. Check food labels for the amount of cholesterol. Choose foods low in cholesterol. Ask your dietitian how much cholesterol you should have each day. Check food labels for the amount of sodium. Choose foods with less than 140 milligrams (mg) per serving. Shopping Buy dairy  products labeled as nonfat (skim) or low-fat (1%). Avoid buying processed or prepackaged foods. These are often high in added sugar, sodium, and fat. Cooking Choose healthy fats when cooking, such as olive oil or canola oil. Cook foods using lower fat methods, such as baking, broiling, boiling, or grilling. Make your own sauces, dressings, and marinades when possible, instead of buying them. Store-bought sauces, dressings, and marinades are often high in sodium and sugar. Meal planning Eat more home-cooked food and less restaurant, buffet, and fast food. Eat fatty fish at least 2 times each week. Examples of fatty fish include salmon, trout, mackerel, tuna, and herring. If you eat whole eggs, do not eat more than 3 egg yolks per week. What foods are recommended? The items listed may not be a complete list. Talk with your dietitian about what dietary choices are best for you. Grains Whole wheat or whole grain breads, crackers, cereals, and pasta. Unsweetened oatmeal. Bulgur. Barley. Quinoa. Brown rice. Whole wheat flour tortillas. Vegetables Fresh or frozen vegetables. Low-sodium canned vegetables. Fruits All fresh, canned (in natural juice), or frozen fruits. Meats and other protein foods Skinless chicken or [place]. Ground chicken or [place]. Lean cuts of pork, trimmed of fat. Fish and seafood, especially salmon, trout, and herring. Egg whites. Dried beans, peas, or lentils. Unsalted nuts or seeds. Unsalted canned beans. Natural peanut or almond butter. Dairy Low-fat dairy products. Skim or low-fat (1%) milk. Reduced fat (2%) and low-sodium cheese. Low-fat ricotta cheese. Low-fat cottage cheese. Plain, low-fat yogurt. Fats and oils Tub margarine without trans fats.  Light or reduced-fat mayonnaise. Light or reduced-fat salad dressings. Avocado. Safflower, olive, sunflower, soybean, and canola oils. What foods are not recommended? The items listed may not be a complete list. Talk with your  dietitian about what dietary choices are best for you. Grains White bread. White (regular) pasta. White rice. Cornbread. Bagels. Pastries. Crackers that contain trans fat. Vegetables Creamed or fried vegetables. Vegetables in a cheese sauce. Fruits Sweetened dried fruit. Canned fruit in syrup. Fruit juice. Meats and other protein foods Fatty cuts of meat. Ribs. Chicken wings. Tomasa Blase. Sausage. Bologna. Salami. Chitterlings. Fatback. Hot dogs. Bratwurst. Packaged lunch meats. Dairy Whole or reduced-fat (2%) milk. Half-and-half. Cream cheese. Full-fat or sweetened yogurt. Full-fat cheese. Nondairy creamers. Whipped toppings. Processed cheese or cheese spreads. Cheese curds. Beverages Alcohol. Sweetened drinks, such as soda, lemonade, fruit drinks, or punches. Fats and oils Butter. Stick margarine. Lard. Shortening. Ghee. Bacon fat. Tropical oils, such as coconut, palm kernel, or palm oils. Sweets and desserts Corn syrup. Sugars. Honey. Molasses. Candy. Jam and jelly. Syrup. Sweetened cereals. Cookies. Pies. Cakes. Donuts. Muffins. Ice cream. Condiments Store-bought sauces, dressings, and marinades that are high in sugar, such as ketchup and barbecue sauce. Summary High levels of triglycerides can increase the risk of heart disease and stroke. Choosing the right foods can help lower your triglycerides. Eat plenty of fresh fruits, vegetables, and whole grains. Choose low-fat dairy and lean meats. Eat fatty fish at least twice a week. Avoid processed and prepackaged foods with added sugar, sodium, saturated fat, and trans fat. If you need suggestions or have questions about what types of food are good for you, talk with your health care provider or a dietitian. This information is not intended to replace advice given to you by your health care provider. Make sure you discuss any questions you have with your health care provider. Document Revised: 10/31/2019 Document Reviewed: 11/02/2019 Elsevier  Patient Education  2022 [employer] DASH Eating Plan DASH stands for Dietary Approaches to Stop Hypertension. The DASH eating plan is a healthy eating plan that has been shown to: Reduce high blood pressure (hypertension). Reduce your risk for type 2 diabetes, heart disease, and stroke. Help with weight loss. What are tips for following this plan? Reading food labels Check food labels for the amount of salt (sodium) per serving. Choose foods with less than 5 percent of the Daily Value of sodium. Generally, foods with less than 300 milligrams (mg) of sodium per serving fit into this eating plan. To find whole grains, look for the word "whole" as the first word in the ingredient list. Shopping Buy products labeled as "low-sodium" or "no salt added." Buy fresh foods. Avoid canned foods and pre-made or frozen meals. Cooking Avoid adding salt when cooking. Use salt-free seasonings or herbs instead of table salt or sea salt. Check with your health care provider or pharmacist before using salt substitutes. Do not fry foods. Cook foods using healthy methods such as baking, boiling, grilling, roasting, and broiling instead. Cook with heart-healthy oils, such as olive, canola, avocado, soybean, or sunflower oil. Meal planning  Eat a balanced diet that includes: 4 or more servings of fruits and 4 or more servings of vegetables each day. Try to fill one-half of your plate with fruits and vegetables. 6-8 servings of whole grains each day. Less than 6 oz (170 g) of lean meat, poultry, or fish each day. A 3-oz (85-g) serving of meat is about the same size as a deck of cards. One egg equals  1 oz (28 g). 2-3 servings of low-fat dairy each day. One serving is 1 cup (237 mL). 1 serving of nuts, seeds, or beans 5 times each week. 2-3 servings of heart-healthy fats. Healthy fats called omega-3 fatty acids are found in foods such as walnuts, flaxseeds, fortified milks, and eggs. These fats are also found in  cold-water fish, such as sardines, salmon, and mackerel. Limit how much you eat of: Canned or prepackaged foods. Food that is high in trans fat, such as some fried foods. Food that is high in saturated fat, such as fatty meat. Desserts and other sweets, sugary drinks, and other foods with added sugar. Full-fat dairy products. Do not salt foods before eating. Do not eat more than 4 egg yolks a week. Try to eat at least 2 vegetarian meals a week. Eat more home-cooked food and less restaurant, buffet, and fast food. Lifestyle When eating at a restaurant, ask that your food be prepared with less salt or no salt, if possible. If you drink alcohol: Limit how much you use to: 0-1 drink a day for women who are not pregnant. 0-2 drinks a day for men. Be aware of how much alcohol is in your drink. In the [place] one drink equals one 12 oz bottle of beer (355 mL), one 5 oz glass of wine (148 mL), or one 1 oz glass of hard liquor (44 mL). General information Avoid eating more than 2,300 mg of salt a day. If you have hypertension, you may need to reduce your sodium intake to 1,500 mg a day. Work with your health care provider to maintain a healthy body weight or to lose weight. Ask what an ideal weight is for you. Get at least 30 minutes of exercise that causes your heart to beat faster (aerobic exercise) most days of the week. Activities may include walking, swimming, or biking. Work with your health care provider or dietitian to adjust your eating plan to your individual calorie needs. What foods should I eat? Fruits All fresh, dried, or frozen fruit. Canned fruit in natural juice (without added sugar). Vegetables Fresh or frozen vegetables (raw, steamed, roasted, or grilled). Low-sodium or reduced-sodium tomato and vegetable juice. Low-sodium or reduced-sodium tomato sauce and tomato paste. Low-sodium or reduced-sodium canned vegetables. Grains Whole-grain or whole-wheat bread. Whole-grain or  whole-wheat pasta. Brown rice. Orpah Cobb. Bulgur. Whole-grain and low-sodium cereals. Pita bread. Low-fat, low-sodium crackers. Whole-wheat flour tortillas. Meats and other proteins Skinless chicken or [place]. Ground chicken or [place]. Pork with fat trimmed off. Fish and seafood. Egg whites. Dried beans, peas, or lentils. Unsalted nuts, nut butters, and seeds. Unsalted canned beans. Lean cuts of beef with fat trimmed off. Low-sodium, lean precooked or cured meat, such as sausages or meat loaves. Dairy Low-fat (1%) or fat-free (skim) milk. Reduced-fat, low-fat, or fat-free cheeses. Nonfat, low-sodium ricotta or cottage cheese. Low-fat or nonfat yogurt. Low-fat, low-sodium cheese. Fats and oils Soft margarine without trans fats. Vegetable oil. Reduced-fat, low-fat, or light mayonnaise and salad dressings (reduced-sodium). Canola, safflower, olive, avocado, soybean, and sunflower oils. Avocado. Seasonings and condiments Herbs. Spices. Seasoning mixes without salt. Other foods Unsalted popcorn and pretzels. Fat-free sweets. The items listed above may not be a complete list of foods and beverages you can eat. Contact a dietitian for more information. What foods should I avoid? Fruits Canned fruit in a light or heavy syrup. Fried fruit. Fruit in cream or butter sauce. Vegetables Creamed or fried vegetables. Vegetables in a cheese sauce. Regular canned vegetables (  not low-sodium or reduced-sodium). Regular canned tomato sauce and paste (not low-sodium or reduced-sodium). Regular tomato and vegetable juice (not low-sodium or reduced-sodium). Rosita Fire. Olives. Grains Baked goods made with fat, such as croissants, muffins, or some breads. Dry pasta or rice meal packs. Meats and other proteins Fatty cuts of meat. Ribs. Fried meat. Tomasa Blase. Bologna, salami, and other precooked or cured meats, such as sausages or meat loaves. Fat from the back of a pig (fatback). Bratwurst. Salted nuts and seeds. Canned  beans with added salt. Canned or smoked fish. Whole eggs or egg yolks. Chicken or [place] with skin. Dairy Whole or 2% milk, cream, and half-and-half. Whole or full-fat cream cheese. Whole-fat or sweetened yogurt. Full-fat cheese. Nondairy creamers. Whipped toppings. Processed cheese and cheese spreads. Fats and oils Butter. Stick margarine. Lard. Shortening. Ghee. Bacon fat. Tropical oils, such as coconut, palm kernel, or palm oil. Seasonings and condiments Onion salt, garlic salt, seasoned salt, table salt, and sea salt. Worcestershire sauce. Tartar sauce. Barbecue sauce. Teriyaki sauce. Soy sauce, including reduced-sodium. Steak sauce. Canned and packaged gravies. Fish sauce. Oyster sauce. Cocktail sauce. Store-bought horseradish. Ketchup. Mustard. Meat flavorings and tenderizers. Bouillon cubes. Hot sauces. Pre-made or packaged marinades. Pre-made or packaged taco seasonings. Relishes. Regular salad dressings. Other foods Salted popcorn and pretzels. The items listed above may not be a complete list of foods and beverages you should avoid. Contact a dietitian for more information. Where to find more information National Heart, Lung, and Blood Institute: PopSteam.is American Heart Association: www.heart.org Academy of Nutrition and Dietetics: www.eatright.org National Kidney Foundation: www.kidney.org Summary The DASH eating plan is a healthy eating plan that has been shown to reduce high blood pressure (hypertension). It may also reduce your risk for type 2 diabetes, heart disease, and stroke. When on the DASH eating plan, aim to eat more fresh fruits and vegetables, whole grains, lean proteins, low-fat dairy, and heart-healthy fats. With the DASH eating plan, you should limit salt (sodium) intake to 2,300 mg a day. If you have hypertension, you may need to reduce your sodium intake to 1,500 mg a day. Work with your health care provider or dietitian to adjust your eating plan to your  individual calorie needs. This information is not intended to replace advice given to you by your health care provider. Make sure you discuss any questions you have with your health care provider. Document Revised: 06/03/2019 Document Reviewed: 06/03/2019 Elsevier Patient Education  2022 [employer].

## 2021-03-14 NOTE — Progress Notes (Signed)
   Subjective:    Patient ID: Phillip Spencer, male    DOB: 1961-06-22, 60 y.o.   MRN: [id number]  HPI Patient was seen today for ADD checkup.  This patient does have ADD.  Patient takes medications for this.  If this does help control overall symptoms.  Please see below. -weight, vital signs reviewed.  The following items were covered. -Compliance with medication : Adderall XR 10 mg daily   -Problems with completing homework, paying attention/taking good notes in school: none  -grades: n/a  - Eating patterns : eating well  -sleeping: sleeping good  -Additional issues or questions: none  Pt also taking med for cholesterol and HTN  Results for orders placed or performed in visit on 11/13/20  Lipid panel  Result Value Ref Range   Cholesterol, Total 220 (H) 100 - 199 mg/dL   Triglycerides 457 (H) 0 - 149 mg/dL   HDL 65 >39 mg/dL   VLDL Cholesterol Cal 73 (H) 5 - 40 mg/dL   LDL Chol Calc (NIH) 82 0 - 99 mg/dL   Chol/HDL Ratio 3.4 0.0 - 5.0 ratio  Basic metabolic panel  Result Value Ref Range   Glucose 102 (H) 65 - 99 mg/dL   BUN 14 6 - 24 mg/dL   Creatinine, Ser 0.75 (L) 0.76 - 1.27 mg/dL   eGFR 104 >59 mL/min/1.73   BUN/Creatinine Ratio 19 9 - 20   Sodium 137 134 - 144 mmol/L   Potassium 5.1 3.5 - 5.2 mmol/L   Chloride 97 96 - 106 mmol/L   CO2 24 20 - 29 mmol/L   Calcium 9.7 8.7 - 10.2 mg/dL  Hemoglobin A1c  Result Value Ref Range   Hgb A1c MFr Bld 5.6 4.8 - 5.6 %   Est. average glucose Bld gHb Est-mCnc 114 mg/dL     Review of Systems     Objective:   Physical Exam  General-in no acute distress Eyes-no discharge Lungs-respiratory rate normal, CTA CV-no murmurs,RRR Extremities skin warm dry no edema Neuro grossly normal Behavior normal, alert       Assessment & Plan:  Patient's been doing a good job with eating exercise has kept his weight down  Adult ADD doing well on medications refills were given it does benefit him he would like to continue  it  Blood pressure borderline bump up dose of lisinopril recheck metabolic 7 follow-up office visit in 2 to 3 weeks to recheck blood pressure  Elevated triglycerides minimize starches in the diet stay active repeat this again in 3 to 6 months at that point in time may or may not have to add medication for that depending on how triglycerides to

## 2021-04-04 ENCOUNTER — Ambulatory Visit: Payer: No Typology Code available for payment source | Admitting: Family Medicine

## 2021-05-15 ENCOUNTER — Other Ambulatory Visit: Payer: Self-pay

## 2021-05-15 ENCOUNTER — Ambulatory Visit
Admission: EM | Admit: 2021-05-15 | Discharge: 2021-05-15 | Disposition: A | Discharge status: Home / Self Care | Payer: 59 | Attending: Internal Medicine | Admitting: Internal Medicine

## 2021-05-15 ENCOUNTER — Encounter: Payer: Self-pay | Admitting: Emergency Medicine

## 2021-05-15 ENCOUNTER — Ambulatory Visit (INDEPENDENT_AMBULATORY_CARE_PROVIDER_SITE_OTHER): Payer: 59

## 2021-05-15 DIAGNOSIS — R509 Fever, unspecified: Secondary | ICD-10-CM | POA: Diagnosis not present

## 2021-05-15 DIAGNOSIS — R059 Cough, unspecified: Secondary | ICD-10-CM | POA: Diagnosis not present

## 2021-05-15 DIAGNOSIS — Z20822 Contact with and (suspected) exposure to covid-19: Secondary | ICD-10-CM | POA: Diagnosis present

## 2021-05-15 DIAGNOSIS — J069 Acute upper respiratory infection, unspecified: Secondary | ICD-10-CM | POA: Diagnosis present

## 2021-05-15 LAB — RAPID INFLUENZA A&B ANTIGENS
Influenza A (ARMC): NEGATIVE
Influenza B (ARMC): NEGATIVE

## 2021-05-15 LAB — SARS CORONAVIRUS 2 (TAT 6-24 HRS): SARS Coronavirus 2: NEGATIVE

## 2021-05-15 NOTE — Discharge Instructions (Addendum)
Your chest xray and flu tests are normal Your covid test wont be done til tomorrow, and we will call you if positive. For now stay quarantined until you get the covid results.  Please monitor your temperature.  If your Covid test ends up positive you may take the following supplements to help your immune system be stronger to fight this viral infection Take Quarcetin 500 mg three times a day x 7 days with Zinc 50 mg ones a day x 7 days. The quarcetin is an antiviral and anti-inflammatory supplement which helps open the zinc channels in the cell to absorb Zinc. Zinc helps decrease the virus load in your body. Take Melatonin 6-10 mg at bed time which also helps support your immune system.  Also make sure to take Vit D 5,000 IU per day with a fatty meal and Vit C 5000 mg a day until you are completely better. To prevent viral illnesses your vitamin D should be between 60-80. Stay on Vitamin D 2,000  and C  1000 mg the rest of the season.  Don't lay around, keep active and walk as much as you are able to to prevent worsening of your symptoms.  Follow up with your family Dr next week.  If you get short of breath and you are able to check  your oxygen with a pulse oxygen meter, if it gets to 92% or less, you need to go to the hospital to be admitted. If you dont have one, come back here and we will assess you.

## 2021-05-15 NOTE — ED Provider Notes (Signed)
[facility]    CSN: [id number] Arrival date & time: 05/15/21  [id number]      History   Chief Complaint Chief Complaint  Patient presents with   Headache   Fatigue   Nasal Congestion    HPI Phillip Spencer is a 60 y.o. male who presents with cough, nose congestion, fatigue, x 4 days. Today started with body aches. Denies fever. Home covid test has been negative 2 days ago. Had a teleheath visit with an NP and was prescribed for Flonase and Tessalon. Would like a covid test ran here. Has not taken any OTC meds. Has had 3 covid shots.     Past Medical History:  Diagnosis Date   GERD (gastroesophageal reflux disease)    Hematuria    Hyperlipidemia    Hypertension    Pre-diabetes    Renal calculus, right     Patient Active Problem List   Diagnosis Date Noted   Routine general medical examination at a health care facility 07/10/2020   Essential hypertension 07/10/2020   Alopecia 07/10/2020   Allergic rhinitis 07/10/2020   Salmonella gastroenteritis 01/21/2019   Attention deficit hyperactivity disorder (ADHD) 12/11/2017   GERD (gastroesophageal reflux disease) 05/16/2015   Hypertriglyceridemia 11/25/2013   Morbid obesity ([place]) 11/25/2013   Hyperlipidemia 02/01/2013   Prediabetes 02/01/2013    Past Surgical History:  Procedure Laterality Date   CHOLECYSTECTOMY  2011   CYSTOSCOPY WITH RETROGRADE PYELOGRAM, URETEROSCOPY AND STENT PLACEMENT Right 09/06/2014   Procedure: CYSTOSCOPY WITH RETROGRADE PYELOGRAM, RIGHT DIAGNOSTIC URETEROSCOPY AND STENT PLACEMENT;  Surgeon: Alexis Frock, MD;  Location: [facility];  Service: Urology;  Laterality: Right;   CYSTOSCOPY WITH RETROGRADE PYELOGRAM, URETEROSCOPY AND STENT PLACEMENT Right 09/27/2014   Procedure: CYSTOSCOPY WITH RETROGRADE PYELOGRAM, RIGHT URETEROSCOPY AND STENT EXCHANGE, LASER LITHOTRIPSY WITH STONE BASKETRY;  Surgeon: Alexis Frock, MD;  Location: [facility];  Service: Urology;   Laterality: Right;   FOOT SURGERY Bilateral    HERNIA REPAIR     ventral x 2   HOLMIUM LASER APPLICATION Right 0000000   Procedure: HOLMIUM LASER APPLICATION;  Surgeon: Alexis Frock, MD;  Location: [facility];  Service: Urology;  Laterality: Right;   KNEE ARTHROSCOPY Right    NASAL SINUS SURGERY         Home Medications    Prior to Admission medications   Medication Sig Start Date End Date Taking? Authorizing Provider  benzonatate (TESSALON) 200 MG capsule Take by mouth. 05/14/21 05/22/21 Yes [provider]  fluticasone (FLONASE) 50 MCG/ACT nasal spray Administer 1 spray into each nostril in the morning and at bedtime. Shake gently. Before first use, prime pump. After use, clean tip and replace cap. 05/14/21 06/13/21 Yes [provider]  amphetamine-dextroamphetamine (ADDERALL XR) 10 MG 24 hr capsule TAKE (1) CAPSULE BY MOUTH EVERY DAY 03/14/21   Kathyrn Drown, MD  amphetamine-dextroamphetamine (ADDERALL XR) 10 MG 24 hr capsule Take one capsule po every day 03/14/21   Kathyrn Drown, MD  amphetamine-dextroamphetamine (ADDERALL XR) 10 MG 24 hr capsule TAKE (1) CAPSULE BY MOUTH EVERY DAY 03/14/21   Kathyrn Drown, MD  aspirin EC 81 MG tablet Take 81 mg by mouth daily.    [provider]  Coenzyme Q10 (CO Q-10) 200 MG CAPS Take 200 mg by mouth daily.     [provider]  finasteride (PROSCAR) 5 MG tablet Take 1 tablet (5 mg total) by mouth daily. 07/10/20   Chalmers Guest, NP  ibuprofen (ADVIL) 200 MG tablet Take 400-800 mg by mouth every 6 (six) hours as needed for fever, mild pain or moderate pain.    [provider]  lisinopril (ZESTRIL) 10 MG tablet TAKE (1) TABLET BY MOUTH EVERY DAY 03/14/21   Kathyrn Drown, MD  Multiple Vitamins-Minerals (PRESERVISION AREDS) CAPS Take 1-2 capsules by mouth daily.    [provider]  Nutritional Supplements (JUICE PLUS FIBRE PO) Take 3 capsules by mouth at bedtime. 1- Veggies, 1-  Fruit, 1-Berry    [provider]  omeprazole (PRILOSEC) 20 MG capsule Take 1 capsule (20 mg total) by mouth 2 (two) times daily before a meal. 10/01/20   Luking, Elayne Snare, MD  OVER THE COUNTER MEDICATION Claritin Flonase    [provider]  OVER THE COUNTER MEDICATION ZQuil    [provider]  PREVALITE 4 GM/DOSE powder TAKE 1 PACKET (4 G TOTAL) BY MOUTH AT BEDTIME. 12/12/20   Luking, Scott A, MD  rosuvastatin (CRESTOR) 40 MG tablet TAKE (1) TABLET BY MOUTH DAILY. 11/13/20   Kathyrn Drown, MD    Family History Family History  Problem Relation Age of Onset   Breast cancer Mother    CAD Father    Pulmonary fibrosis Father     Social History Social History   Tobacco Use   Smoking status: Former    Types: Cigarettes    Quit date: 08/31/1984    Years since quitting: 36.7   Smokeless tobacco: Former    Quit date: 02/05/1991   Tobacco comments:    only smoked for one year. quit in 1985. quit smokeless tobacco in  1992  Vaping Use   Vaping Use: Never used  Substance Use Topics   Alcohol use: Yes    Alcohol/week: 4.0 standard drinks    Types: 4 Cans of beer per week   Drug use: Not Currently     Allergies   Amoxicillin   Review of Systems Review of Systems  Constitutional:  Positive for chills and fatigue. Negative for activity change and appetite change.  HENT:  Positive for congestion and rhinorrhea. Negative for ear discharge, ear pain, sore throat and trouble swallowing.   Eyes:  Negative for discharge.  Respiratory:  Positive for cough. Negative for shortness of breath and wheezing.   Genitourinary:  Negative for difficulty urinating, dysuria and frequency.  Musculoskeletal:  Positive for myalgias. Negative for gait problem.  Skin:  Negative for rash.  Neurological:  Positive for headaches.  Hematological:  Negative for adenopathy.    Physical Exam Triage Vital Signs ED Triage Vitals  Enc Vitals Group     BP 05/15/21 0917 135/90     Pulse  Rate 05/15/21 0917 77     Resp 05/15/21 0917 20     Temp 05/15/21 0917 99.6 F (37.6 C)     Temp Source 05/15/21 0917 Oral     SpO2 05/15/21 0917 94 %     Weight --      Height --      Head Circumference --      Peak Flow --      Pain Score 05/15/21 0914 0     Pain Loc --      Pain Edu? --      Excl. in [place]? --    No data found.  Updated Vital Signs BP 135/90 (BP Location: Left Arm)   Pulse 77   Temp 99.6 F (37.6 C) (Oral)   Resp 20  SpO2 94%   Visual Acuity Right Eye Distance:   Left Eye Distance:   Bilateral Distance:    Right Eye Near:   Left Eye Near:    Bilateral Near:     Physical Exam Physical Exam Vitals signs and nursing note reviewed.  Constitutional:      General: he is not in acute distress.    Appearance: Normal appearance. he is  ill-appearing, but not toxic-appearing or diaphoretic.  HENT:     Head: Normocephalic.     Right Ear: Tympanic membrane, ear canal and external ear normal.     Left Ear: Tympanic membrane, ear canal and external ear normal.     Nose: Nose normal.     Mouth/Throat:     Mouth: Mucous membranes are moist.  Eyes:     General: No scleral icterus.       Right eye: No discharge.        Left eye: No discharge.     Conjunctiva/sclera: Conjunctivae normal.  Neck:     Musculoskeletal: Neck supple. No neck rigidity.  Cardiovascular:     Rate and Rhythm: Normal rate and regular rhythm.     Heart sounds: No murmur.  Pulmonary:     Effort: Pulmonary effort is normal.     Breath sounds: Normal breath sounds.  Musculoskeletal: Normal range of motion.  Lymphadenopathy:     Cervical: No cervical adenopathy.  Skin:    General: Skin is warm and dry.     Coloration: Skin is not jaundiced.     Findings: No rash.  Neurological:     Mental Status: he is alert and oriented to person, place, and time.     Gait: Gait normal.  Psychiatric:        Mood and Affect: Mood normal.        Behavior: Behavior normal.        Thought Content:  Thought content normal.        Judgment: Judgment normal.    UC Treatments / Results  Labs (all labs ordered are listed, but only abnormal results are displayed) Labs Reviewed  RAPID INFLUENZA A&B ANTIGENS  SARS CORONAVIRUS 2 (TAT 6-24 HRS)    EKG   Radiology DG Chest 2 View  Result Date: 05/15/2021 CLINICAL DATA:  Cough, fever, aches, low pulse ox of 94% EXAM: CHEST - 2 VIEW COMPARISON:  None. FINDINGS: The heart size and mediastinal contours are within normal limits. Both lungs are clear. No pleural effusion. The visualized skeletal structures are unremarkable. IMPRESSION: No acute process in the chest. Electronically Signed   By: Guadlupe Spanish M.D.   On: 05/15/2021 10:05    Procedures Procedures (including critical care time)  Medications Ordered in UC Medications - No data to display  Initial Impression / Assessment and Plan / UC Course  I have reviewed the triage vital signs and the nursing notes.  Pertinent labs & imaging results that were available during my care of the patient were reviewed by me and considered in my medical decision making (see chart for details).  Viral illness covid suspect.  Suportive care advised, may continue current meds. Educated how to monitor his Pulse ox. See instructions.  Final Clinical Impressions(s) / UC Diagnoses   Final diagnoses:  Upper respiratory tract infection, unspecified type  Suspected COVID-19 virus infection     Discharge Instructions      Your chest xray and flu tests are normal Your covid test wont be done til tomorrow, and we  will call you if positive. For now stay quarantined until you get the covid results.  Please monitor your temperature.  If your Covid test ends up positive you may take the following supplements to help your immune system be stronger to fight this viral infection Take Quarcetin 500 mg three times a day x 7 days with Zinc 50 mg ones a day x 7 days. The quarcetin is an antiviral and  anti-inflammatory supplement which helps open the zinc channels in the cell to absorb Zinc. Zinc helps decrease the virus load in your body. Take Melatonin 6-10 mg at bed time which also helps support your immune system.  Also make sure to take Vit D 5,000 IU per day with a fatty meal and Vit C 5000 mg a day until you are completely better. To prevent viral illnesses your vitamin D should be between 60-80. Stay on Vitamin D 2,000  and C  1000 mg the rest of the season.  Don't lay around, keep active and walk as much as you are able to to prevent worsening of your symptoms.  Follow up with your family Dr next week.  If you get short of breath and you are able to check  your oxygen with a pulse oxygen meter, if it gets to 92% or less, you need to go to the hospital to be admitted. If you dont have one, come back here and we will assess you.        ED Prescriptions   None    PDMP not reviewed this encounter.   Shelby Mattocks, PA-C 05/15/21 1054

## 2021-05-15 NOTE — ED Triage Notes (Addendum)
Pt presents today with c/o nasal congestion, fatigue and body aches x 4 days. Denies fever. Home Covid test taken two days ago, negative. He also reports being seen by Nurse Practitioner yesterday via Telehealth and was prescribed Flonase and Benzanate. Requests to be tested for Covid today. No OTC meds pta.

## 2021-05-17 ENCOUNTER — Ambulatory Visit: Payer: No Typology Code available for payment source | Admitting: Family Medicine

## 2021-05-19 ENCOUNTER — Emergency Department
Admission: EM | Admit: 2021-05-19 | Discharge: 2021-05-19 | Disposition: A | Discharge status: Home / Self Care | Payer: 59 | Attending: Emergency Medicine | Admitting: Emergency Medicine

## 2021-05-19 ENCOUNTER — Emergency Department: Payer: 59

## 2021-05-19 ENCOUNTER — Other Ambulatory Visit: Payer: Self-pay

## 2021-05-19 DIAGNOSIS — Z79899 Other long term (current) drug therapy: Secondary | ICD-10-CM | POA: Diagnosis not present

## 2021-05-19 DIAGNOSIS — I10 Essential (primary) hypertension: Secondary | ICD-10-CM | POA: Insufficient documentation

## 2021-05-19 DIAGNOSIS — J069 Acute upper respiratory infection, unspecified: Secondary | ICD-10-CM | POA: Insufficient documentation

## 2021-05-19 DIAGNOSIS — U071 COVID-19: Secondary | ICD-10-CM | POA: Diagnosis not present

## 2021-05-19 DIAGNOSIS — R059 Cough, unspecified: Secondary | ICD-10-CM | POA: Diagnosis present

## 2021-05-19 LAB — BASIC METABOLIC PANEL
Anion gap: 7 (ref 5–15)
BUN: 19 mg/dL (ref 6–20)
CO2: 26 mmol/L (ref 22–32)
Calcium: 9 mg/dL (ref 8.9–10.3)
Chloride: 106 mmol/L (ref 98–111)
Creatinine, Ser: 0.59 mg/dL — ABNORMAL LOW (ref 0.61–1.24)
GFR, Estimated: >60 mL/min (ref >60)
Glucose, Bld: 124 mg/dL — ABNORMAL HIGH (ref 70–99)
Potassium: 4.3 mmol/L (ref 3.5–5.1)
Sodium: 139 mmol/L (ref 135–145)

## 2021-05-19 LAB — RESP PANEL BY RT-PCR (FLU A&B, COVID) ARPGX2
Influenza A by PCR: NEGATIVE
Influenza B by PCR: NEGATIVE
SARS Coronavirus 2 by RT PCR: POSITIVE — AB

## 2021-05-19 LAB — CBC
HCT: 45.4 % (ref 39.0–52.0)
Hemoglobin: 15.6 g/dL (ref 13.0–17.0)
MCH: 33.5 pg (ref 26.0–34.0)
MCHC: 34.4 g/dL (ref 30.0–36.0)
MCV: 97.4 fL (ref 80.0–100.0)
Platelets: 178 10*3/uL (ref 150–400)
RBC: 4.66 MIL/uL (ref 4.22–5.81)
RDW: 12.1 % (ref 11.5–15.5)
WBC: 6.5 10*3/uL (ref 4.0–10.5)
nRBC: 0 % (ref 0.0–0.2)

## 2021-05-19 LAB — TROPONIN I (HIGH SENSITIVITY): Troponin I (High Sensitivity): 6 ng/L (ref <18)

## 2021-05-19 MED ORDER — ALBUTEROL SULFATE HFA 108 (90 BASE) MCG/ACT IN AERS: 2.0000 | INHALATION_SPRAY | Freq: Four times a day (QID) | RESPIRATORY_TRACT | 2 refills | Status: DC | PRN

## 2021-05-19 MED ORDER — ALBUTEROL SULFATE HFA 108 (90 BASE) MCG/ACT IN AERS
1.0000 | INHALATION_SPRAY | Freq: Once | RESPIRATORY_TRACT | Status: DC
  Filled 2021-05-19: qty 6.7

## 2021-05-19 NOTE — Discharge Instructions (Signed)
We have sent your COVID test.  You will receive a phone call if it is positive.  You can use the albuterol inhaler as needed for any difficulty breathing.

## 2021-05-19 NOTE — ED Provider Notes (Signed)
[facility]  ____________________________________________   Event Date/Time   First MD Initiated Contact with Patient 05/19/21 1029     (approximate)  I have reviewed the triage vital signs and the nursing notes.   HISTORY  Chief Complaint Chest Pain    HPI Phillip Spencer is a 60 y.o. male with past medical history of GERD, hyperlipidemia, hypertension who presents with cough and congestion x1 week.  He endorses cough that is productive of clear sputum but denies shortness of breath.  Has had runny nose as well.  Denies nausea vomiting abdominal pain diarrhea.  He initially had a negative home COVID test and a negative test at clinic.  Last night started to have some burning in the center of his chest which is intermittent and not associated with exertion and not associated with any other symptoms.  He then took another home COVID test and this was positive.  He is concerned about the conflicting results and would like to know if he is COVID-positive.  He denies any ongoing chest pain.  Denies any lower extremity swelling or history of blood clots.  Patient's wife also has similar symptoms and tested positive for COVID today  Past Medical History:  Diagnosis Date   GERD (gastroesophageal reflux disease)    Hematuria    Hyperlipidemia    Hypertension    Pre-diabetes    Renal calculus, right     Patient Active Problem List   Diagnosis Date Noted   Routine general medical examination at a health care facility 07/10/2020   Essential hypertension 07/10/2020   Alopecia 07/10/2020   Allergic rhinitis 07/10/2020   Salmonella gastroenteritis 01/21/2019   Attention deficit hyperactivity disorder (ADHD) 12/11/2017   GERD (gastroesophageal reflux disease) 05/16/2015   Hypertriglyceridemia 11/25/2013   Morbid obesity ([place]) 11/25/2013   Hyperlipidemia 02/01/2013   Prediabetes 02/01/2013    Past Surgical History:  Procedure Laterality Date    CHOLECYSTECTOMY  2011   CYSTOSCOPY WITH RETROGRADE PYELOGRAM, URETEROSCOPY AND STENT PLACEMENT Right 09/06/2014   Procedure: CYSTOSCOPY WITH RETROGRADE PYELOGRAM, RIGHT DIAGNOSTIC URETEROSCOPY AND STENT PLACEMENT;  Surgeon: Alexis Frock, MD;  Location: [facility];  Service: Urology;  Laterality: Right;   CYSTOSCOPY WITH RETROGRADE PYELOGRAM, URETEROSCOPY AND STENT PLACEMENT Right 09/27/2014   Procedure: CYSTOSCOPY WITH RETROGRADE PYELOGRAM, RIGHT URETEROSCOPY AND STENT EXCHANGE, LASER LITHOTRIPSY WITH STONE BASKETRY;  Surgeon: Alexis Frock, MD;  Location: [facility];  Service: Urology;  Laterality: Right;   FOOT SURGERY Bilateral    HERNIA REPAIR     ventral x 2   HOLMIUM LASER APPLICATION Right 0000000   Procedure: HOLMIUM LASER APPLICATION;  Surgeon: Alexis Frock, MD;  Location: [facility];  Service: Urology;  Laterality: Right;   KNEE ARTHROSCOPY Right    NASAL SINUS SURGERY      Prior to Admission medications   Medication Sig Start Date End Date Taking? Authorizing Provider  albuterol (VENTOLIN HFA) 108 (90 Base) MCG/ACT inhaler Inhale 2 puffs into the lungs every 6 (six) hours as needed for wheezing or shortness of breath. 05/19/21  Yes Rada Hay, MD  amphetamine-dextroamphetamine (ADDERALL XR) 10 MG 24 hr capsule TAKE (1) CAPSULE BY MOUTH EVERY DAY 03/14/21   Kathyrn Drown, MD  amphetamine-dextroamphetamine (ADDERALL XR) 10 MG 24 hr capsule Take one capsule po every day 03/14/21   Kathyrn Drown, MD  amphetamine-dextroamphetamine (ADDERALL XR) 10 MG 24 hr capsule TAKE (1) CAPSULE BY MOUTH EVERY DAY 03/14/21   Luking,  Elayne Snare, MD  aspirin EC 81 MG tablet Take 81 mg by mouth daily.    [provider]  benzonatate (TESSALON) 200 MG capsule Take by mouth. 05/14/21 05/22/21  [provider]  Coenzyme Q10 (CO Q-10) 200 MG CAPS Take 200 mg by mouth daily.     [provider]  finasteride (PROSCAR) 5 MG tablet  Take 1 tablet (5 mg total) by mouth daily. 07/10/20   Chalmers Guest, NP  fluticasone (FLONASE) 50 MCG/ACT nasal spray Administer 1 spray into each nostril in the morning and at bedtime. Shake gently. Before first use, prime pump. After use, clean tip and replace cap. 05/14/21 06/13/21  [provider]  ibuprofen (ADVIL) 200 MG tablet Take 400-800 mg by mouth every 6 (six) hours as needed for fever, mild pain or moderate pain.    [provider]  lisinopril (ZESTRIL) 10 MG tablet TAKE (1) TABLET BY MOUTH EVERY DAY 03/14/21   Kathyrn Drown, MD  Multiple Vitamins-Minerals (PRESERVISION AREDS) CAPS Take 1-2 capsules by mouth daily.    [provider]  Nutritional Supplements (JUICE PLUS FIBRE PO) Take 3 capsules by mouth at bedtime. 1- Veggies, 1- Fruit, 1-Berry    [provider]  omeprazole (PRILOSEC) 20 MG capsule Take 1 capsule (20 mg total) by mouth 2 (two) times daily before a meal. 10/01/20   Luking, Elayne Snare, MD  OVER THE COUNTER MEDICATION Claritin Flonase    [provider]  OVER THE COUNTER MEDICATION ZQuil    [provider]  PREVALITE 4 GM/DOSE powder TAKE 1 PACKET (4 G TOTAL) BY MOUTH AT BEDTIME. 12/12/20   Luking, Scott A, MD  rosuvastatin (CRESTOR) 40 MG tablet TAKE (1) TABLET BY MOUTH DAILY. 11/13/20   Kathyrn Drown, MD    Allergies Amoxicillin  Family History  Problem Relation Age of Onset   Breast cancer Mother    CAD Father    Pulmonary fibrosis Father     Social History Social History   Tobacco Use   Smoking status: Former    Types: Cigarettes    Quit date: 08/31/1984    Years since quitting: 36.7   Smokeless tobacco: Former    Quit date: 02/05/1991   Tobacco comments:    only smoked for one year. quit in 1985. quit smokeless tobacco in  1992  Vaping Use   Vaping Use: Never used  Substance Use Topics   Alcohol use: Yes    Alcohol/week: 4.0 standard drinks    Types: 4 Cans of beer per week   Drug use: Not  Currently    Review of Systems   Review of Systems  Constitutional:  Negative for appetite change, chills and fever.  Respiratory:  Positive for cough and chest tightness. Negative for shortness of breath.   Cardiovascular:  Positive for chest pain.  Gastrointestinal:  Negative for abdominal pain, nausea and vomiting.  All other systems reviewed and are negative.  Physical Exam Updated Vital Signs BP 130/89   Pulse 67   Temp 98.6 F (37 C) (Oral)   Resp 18   Ht 6' (1.829 m)   Wt 116.1 kg   SpO2 95%   BMI 34.72 kg/m   Physical Exam Vitals and nursing note reviewed.  Constitutional:      General: He is not in acute distress.    Appearance: Normal appearance.  HENT:     Head: Normocephalic and atraumatic.  Eyes:     General: No scleral icterus.  Conjunctiva/sclera: Conjunctivae normal.  Cardiovascular:     Heart sounds: Normal heart sounds.  Pulmonary:     Effort: Pulmonary effort is normal. No respiratory distress.     Breath sounds: Normal breath sounds.     Comments: Scattered expiratory wheezing with good air movement Musculoskeletal:        General: No deformity or signs of injury.     Cervical back: Normal range of motion.     Right lower leg: No edema.     Left lower leg: No edema.  Skin:    General: Skin is warm.     Coloration: Skin is not jaundiced or pale.  Neurological:     General: No focal deficit present.     Mental Status: He is alert and oriented to person, place, and time. Mental status is at baseline.  Psychiatric:        Mood and Affect: Mood normal.        Behavior: Behavior normal.     LABS (all labs ordered are listed, but only abnormal results are displayed)  Labs Reviewed  BASIC METABOLIC PANEL - Abnormal; Notable for the following components:      Result Value   Glucose, Bld 124 (*)    Creatinine, Ser 0.59 (*)    All other components within normal limits  RESP PANEL BY RT-PCR (FLU A&B, COVID) ARPGX2  RESP PANEL BY RT-PCR (FLU  A&B, COVID) ARPGX2  CBC  TROPONIN I (HIGH SENSITIVITY)  TROPONIN I (HIGH SENSITIVITY)   ____________________________________________  EKG NSR, nml axis, nml intervals, no acute ischemic changes  ____________________________________________  RADIOLOGY Ky Barban, personally viewed and evaluated these images (plain radiographs) as part of my medical decision making, as well as reviewing the written report by the radiologist.  ED MD interpretation:  I reviewed the CXR which does not show any acute cardiopulmonary process      ____________________________________________   PROCEDURES  Procedure(s) performed (including Critical Care):  Procedures   ____________________________________________   INITIAL IMPRESSION / ASSESSMENT AND PLAN / ED COURSE     Patient is a 60 year old male presenting with 1 week of cough congestion and viral symptoms.  His wife is also sick.  Initially had a negative home COVID test returned positive today.  He also had some chest pain that was short-lived and not associated with other symptoms that started last night and he noticed this morning as well.  His vital signs within normal limits.  He is overall well-appearing does have some scattered expiratory wheezing, no history of asthma or COPD.  His chest x-ray not show any infiltrate.  His EKG is nonischemic.  Troponin is negative and pain has been going on for greater than 3 hours so I feel reassured that this is not ischemic in nature.  He does have some scattered expiratory wheezing but is moving good air feel that this is most likely bronchospasm related to a viral infection.  Will discharge with albuterol inhaler.  Unfortunately patient's initial COVID test was never sent to the lab.  So we are resending this.  She prefers to go home and look up his COVID results from home.      ____________________________________________   FINAL CLINICAL IMPRESSION(S) / ED DIAGNOSES  Final diagnoses:   Viral upper respiratory infection     ED Discharge Orders          Ordered    albuterol (VENTOLIN HFA) 108 (90 Base) MCG/ACT inhaler  Every 6 hours PRN  05/19/21 1149             Note:  This document was prepared using Dragon voice recognition software and may include unintentional dictation errors.    Rada Hay, MD 05/19/21 1209

## 2021-05-19 NOTE — ED Triage Notes (Signed)
Pt comes pov with chest pain r/t covid. Pt states the cp started this morning. Covid symptoms a week ago. Positive test this morning. Center of chest tightness with cough and breathing.

## 2021-05-19 NOTE — ED Provider Notes (Signed)
Emergency Medicine Provider Triage Evaluation Note  Phillip Spencer , a 60 y.o. male  was evaluated in triage.  Pt complains of positive COVID test and URI symptoms.  Mild fever and chills earlier in the week.  Was seen at urgent care and had a negative COVID.  Chest [place]is morning..  Review of Systems  Positive: Covid symptoms, cough congestion no chest pain some difficulty breathing Negative: Vomiting, diarrhea  Physical Exam  BP 130/89   Pulse 67   Temp 98.6 F (37 C) (Oral)   Resp 18   Ht 6' (1.829 m)   Wt 116.1 kg   SpO2 95%   BMI 34.72 kg/m  Gen:   Awake, no distress   Resp:  Normal effort, wheezing noted bilaterally MSK:   Moves extremities without difficulty  Other:    Medical Decision Making  Medically screening exam initiated at 8:26 AM.  Appropriate orders placed.  [name] was informed that the remainder of the evaluation will be completed by another provider, this initial triage assessment does not replace that evaluation, and the importance of remaining in the ED until their evaluation is complete.     Faythe Ghee, PA-C 05/19/21 0830    Georga Hacking, MD 05/19/21 [phone]

## 2021-05-21 ENCOUNTER — Ambulatory Visit: Payer: No Typology Code available for payment source | Admitting: Family Medicine

## 2021-06-10 ENCOUNTER — Other Ambulatory Visit: Payer: Self-pay | Admitting: Family Medicine

## 2021-06-10 DIAGNOSIS — F909 Attention-deficit hyperactivity disorder, unspecified type: Secondary | ICD-10-CM

## 2021-06-12 ENCOUNTER — Other Ambulatory Visit: Payer: Self-pay | Admitting: Family Medicine

## 2021-06-12 DIAGNOSIS — F909 Attention-deficit hyperactivity disorder, unspecified type: Secondary | ICD-10-CM

## 2021-06-12 NOTE — Telephone Encounter (Signed)
Patient states there is a shortage on adderall and he found that [facility] in [place] has the dosage he needs and can fill it. He asks that Dr. [name] approve to have it filled. Please advise.  [facility] [place]  CB#  [phone]

## 2021-06-13 MED ORDER — AMPHETAMINE-DEXTROAMPHET ER 10 MG PO CP24: ORAL_CAPSULE | ORAL | 0 refills | Status: DC

## 2021-06-24 ENCOUNTER — Other Ambulatory Visit: Payer: Self-pay | Admitting: Family Medicine

## 2021-06-28 ENCOUNTER — Other Ambulatory Visit: Payer: Self-pay

## 2021-06-28 ENCOUNTER — Ambulatory Visit: Payer: 59 | Admitting: Family Medicine

## 2021-06-28 DIAGNOSIS — Z125 Encounter for screening for malignant neoplasm of prostate: Secondary | ICD-10-CM

## 2021-06-28 DIAGNOSIS — Z79899 Other long term (current) drug therapy: Secondary | ICD-10-CM | POA: Diagnosis not present

## 2021-06-28 DIAGNOSIS — I10 Essential (primary) hypertension: Secondary | ICD-10-CM

## 2021-06-28 DIAGNOSIS — E7849 Other hyperlipidemia: Secondary | ICD-10-CM

## 2021-06-28 DIAGNOSIS — F909 Attention-deficit hyperactivity disorder, unspecified type: Secondary | ICD-10-CM

## 2021-06-28 LAB — BASIC METABOLIC PANEL
BUN/Creatinine Ratio: 19 (ref 9–20)
BUN: 14 mg/dL (ref 6–24)
CO2: 23 mmol/L (ref 20–29)
Calcium: 9.4 mg/dL (ref 8.7–10.2)
Chloride: 103 mmol/L (ref 96–106)
Creatinine, Ser: 0.72 mg/dL — ABNORMAL LOW (ref 0.76–1.27)
Glucose: 111 mg/dL — ABNORMAL HIGH (ref 70–99)
Potassium: 5 mmol/L (ref 3.5–5.2)
Sodium: 142 mmol/L (ref 134–144)
eGFR: 105 mL/min/{1.73_m2} (ref >59)

## 2021-06-28 MED ORDER — LISINOPRIL 10 MG PO TABS: ORAL_TABLET | ORAL | 5 refills | Status: DC

## 2021-06-28 MED ORDER — AMPHETAMINE-DEXTROAMPHET ER 10 MG PO CP24: ORAL_CAPSULE | ORAL | 0 refills | Status: DC

## 2021-06-28 NOTE — Progress Notes (Signed)
° °  Subjective:    Patient ID: Phillip Spencer, male    DOB: Mar 25, 1961, 60 y.o.   MRN: [id number]  HPI This patient has adult ADD. Takes medication responsibly. Medication does help the patient focus in be more functional. Patient relates that they are or not abusing the medication or misusing the medication. The patient understands that if they're having any negative side effects such as elevated high blood pressure severe headaches they would need stop the medication follow-up immediately. They also understand that the prescriptions are to last for 3 months then the patient will need to follow-up before having further prescriptions.  Patient compliance Yes  Does medication help patient function /attention better  Yes  Side effects  None   Review of Systems     Objective:   Physical Exam  General-in no acute distress Eyes-no discharge Lungs-respiratory rate normal, CTA CV-no murmurs,RRR Extremities skin warm dry no edema Neuro grossly normal Behavior normal, alert       Assessment & Plan:  1. Essential hypertension Bp good control Diet and activty Continue meds - Lipid Profile - Hepatic function panel - Basic Metabolic Panel (BMET) - PSA  2. Attention deficit hyperactivity disorder (ADHD), unspecified ADHD type The patient was seen today as part of the visit regarding ADD.  Patient is stable on current regimen.  Appropriate prescriptions prescribed.  Medications were reviewed with the patient as well as compliance. Side effects were checked for. Discussion regarding effectiveness was held. Prescriptions were electronically sent in.  Patient reminded to follow-up in approximately 3 months.   Plans to [facility] law with drug registry was checked and verified while present with the patient.  - amphetamine-dextroamphetamine (ADDERALL XR) 10 MG 24 hr capsule; TAKE (1) CAPSULE BY MOUTH EVERY DAY  Dispense: 30 capsule; Refill: 0 - amphetamine-dextroamphetamine  (ADDERALL XR) 10 MG 24 hr capsule; TAKE (1) CAPSULE BY MOUTH EVERY DAY  Dispense: 30 capsule; Refill: 0 - amphetamine-dextroamphetamine (ADDERALL XR) 10 MG 24 hr capsule; TAKE (1) CAPSULE BY MOUTH EVERY DAY  Dispense: 30 capsule; Refill: 0  3. High risk medication use Check liver and kidney - Lipid Profile - Hepatic function panel - Basic Metabolic Panel (BMET) - PSA  4. Screening PSA (prostate specific antigen) screening - Lipid Profile - Hepatic function panel - Basic Metabolic Panel (BMET) - PSA  5. Other hyperlipidemia Check lipid, watch diet continue meds - Lipid Profile - Hepatic function panel - Basic Metabolic Panel (BMET) - PSA  Recheck in 3 months labs and ov and ADD check and Wellness

## 2021-07-17 ENCOUNTER — Other Ambulatory Visit: Payer: Self-pay | Admitting: Family Medicine

## 2021-07-17 DIAGNOSIS — L659 Nonscarring hair loss, unspecified: Secondary | ICD-10-CM

## 2021-07-17 MED ORDER — FINASTERIDE 5 MG PO TABS: 5.0000 mg | ORAL_TABLET | Freq: Every day | ORAL | 1 refills | Status: DC

## 2021-08-19 ENCOUNTER — Other Ambulatory Visit: Payer: Self-pay | Admitting: Family Medicine

## 2021-08-28 ENCOUNTER — Ambulatory Visit
Admission: EM | Admit: 2021-08-28 | Discharge: 2021-08-28 | Disposition: A | Discharge status: Home / Self Care | Payer: 59 | Attending: Emergency Medicine | Admitting: Emergency Medicine

## 2021-08-28 ENCOUNTER — Other Ambulatory Visit: Payer: Self-pay

## 2021-08-28 DIAGNOSIS — R52 Pain, unspecified: Secondary | ICD-10-CM | POA: Diagnosis not present

## 2021-08-28 DIAGNOSIS — Z20822 Contact with and (suspected) exposure to covid-19: Secondary | ICD-10-CM | POA: Diagnosis not present

## 2021-08-28 DIAGNOSIS — J111 Influenza due to unidentified influenza virus with other respiratory manifestations: Secondary | ICD-10-CM

## 2021-08-28 DIAGNOSIS — R051 Acute cough: Secondary | ICD-10-CM

## 2021-08-28 DIAGNOSIS — R059 Cough, unspecified: Secondary | ICD-10-CM | POA: Diagnosis present

## 2021-08-28 DIAGNOSIS — R519 Headache, unspecified: Secondary | ICD-10-CM | POA: Diagnosis not present

## 2021-08-28 LAB — RESP PANEL BY RT-PCR (FLU A&B, COVID) ARPGX2
Influenza A by PCR: NEGATIVE
Influenza B by PCR: NEGATIVE
SARS Coronavirus 2 by RT PCR: NEGATIVE

## 2021-08-28 MED ORDER — PREDNISONE 10 MG (21) PO TBPK: ORAL_TABLET | Freq: Every day | ORAL | 0 refills | Status: DC

## 2021-08-28 MED ORDER — OSELTAMIVIR PHOSPHATE 75 MG PO CAPS: 75.0000 mg | ORAL_CAPSULE | Freq: Two times a day (BID) | ORAL | 0 refills | Status: DC

## 2021-08-28 MED ORDER — METHYLPREDNISOLONE SODIUM SUCC 125 MG IJ SOLR
125.0000 mg | Freq: Once | INTRAMUSCULAR | Status: AC
  Administered 2021-08-28: 125 mg via INTRAMUSCULAR

## 2021-08-28 MED ORDER — DEXTROMETHORPHAN HBR 15 MG/5ML PO SYRP: 10.0000 mL | ORAL_SOLUTION | Freq: Four times a day (QID) | ORAL | 0 refills | Status: DC | PRN

## 2021-08-28 MED ORDER — KETOROLAC TROMETHAMINE 60 MG/2ML IM SOLN
60.0000 mg | Freq: Once | INTRAMUSCULAR | Status: AC
  Administered 2021-08-28: 60 mg via INTRAMUSCULAR

## 2021-08-28 NOTE — ED Triage Notes (Signed)
Pt here with C/O cough since yesterday, headache. States he just feels awful

## 2021-08-28 NOTE — Discharge Instructions (Addendum)
Continue to use your Flonase nasal spray Take Tylenol and Motrin as needed for pain or fever Use cough medicine as needed If symptoms become worse you will need to return

## 2021-09-02 ENCOUNTER — Other Ambulatory Visit: Payer: Self-pay | Admitting: Family Medicine

## 2021-09-02 DIAGNOSIS — F909 Attention-deficit hyperactivity disorder, unspecified type: Secondary | ICD-10-CM

## 2021-09-02 MED ORDER — AMPHETAMINE-DEXTROAMPHET ER 10 MG PO CP24: ORAL_CAPSULE | ORAL | 0 refills | Status: DC

## 2021-09-15 ENCOUNTER — Encounter: Payer: Self-pay | Admitting: Family Medicine

## 2021-09-15 DIAGNOSIS — K2270 Barrett's esophagus without dysplasia: Secondary | ICD-10-CM | POA: Insufficient documentation

## 2021-09-15 DIAGNOSIS — K635 Polyp of colon: Secondary | ICD-10-CM

## 2021-09-15 HISTORY — DX: Polyp of colon: K63.5

## 2021-09-15 HISTORY — DX: Barrett's esophagus without dysplasia: K22.70

## 2021-09-27 ENCOUNTER — Encounter: Payer: 59 | Admitting: Family Medicine

## 2021-09-30 ENCOUNTER — Other Ambulatory Visit: Payer: Self-pay | Admitting: Family Medicine

## 2021-09-30 ENCOUNTER — Telehealth: Payer: Self-pay | Admitting: Family Medicine

## 2021-09-30 DIAGNOSIS — F909 Attention-deficit hyperactivity disorder, unspecified type: Secondary | ICD-10-CM

## 2021-09-30 MED ORDER — AMPHETAMINE-DEXTROAMPHET ER 10 MG PO CP24: ORAL_CAPSULE | ORAL | 0 refills | Status: DC

## 2021-09-30 NOTE — Telephone Encounter (Signed)
I will send in prescription for ADD medicine as requested but patient needs to schedule an office visit within the next 30 days.  Please assist him at doing so thank you ?

## 2021-10-01 ENCOUNTER — Telehealth: Payer: Self-pay

## 2021-10-01 NOTE — Telephone Encounter (Signed)
Pt called in to schedule annual physical for 12/18/21. Pt will need lab work for appt. ? ?Cb#: [phone] ?

## 2021-10-01 NOTE — Telephone Encounter (Signed)
Patient notified

## 2021-10-01 NOTE — Telephone Encounter (Signed)
Patient has active labs ordered in Epic.  Left message to return call to notify patient. ?

## 2021-10-31 ENCOUNTER — Other Ambulatory Visit: Payer: Self-pay | Admitting: Family Medicine

## 2021-10-31 DIAGNOSIS — F909 Attention-deficit hyperactivity disorder, unspecified type: Secondary | ICD-10-CM

## 2021-11-01 ENCOUNTER — Other Ambulatory Visit: Payer: Self-pay | Admitting: Family Medicine

## 2021-11-01 DIAGNOSIS — F909 Attention-deficit hyperactivity disorder, unspecified type: Secondary | ICD-10-CM

## 2021-11-01 MED ORDER — AMPHETAMINE-DEXTROAMPHET ER 10 MG PO CP24: ORAL_CAPSULE | ORAL | 0 refills | Status: DC

## 2021-11-01 NOTE — Telephone Encounter (Signed)
Nurses with this being a controlled medicine he is very important I see him at least every 4 months-state law states 3 months-but we can stretch it by 1 month-in other words he needs an appointment within the next 30 days with me-I will send in a refill ?

## 2021-11-04 MED ORDER — AMPHETAMINE-DEXTROAMPHET ER 10 MG PO CP24: ORAL_CAPSULE | ORAL | 0 refills | Status: DC

## 2021-12-17 LAB — LIPID PANEL
Chol/HDL Ratio: 2.3 ratio (ref 0.0–5.0)
Cholesterol, Total: 193 mg/dL (ref 100–199)
HDL: 83 mg/dL (ref >39)
LDL Chol Calc (NIH): 90 mg/dL (ref 0–99)
Triglycerides: 119 mg/dL (ref 0–149)
VLDL Cholesterol Cal: 20 mg/dL (ref 5–40)

## 2021-12-17 LAB — BASIC METABOLIC PANEL
BUN/Creatinine Ratio: 17 (ref 10–24)
BUN: 12 mg/dL (ref 8–27)
CO2: 23 mmol/L (ref 20–29)
Calcium: 9.6 mg/dL (ref 8.6–10.2)
Chloride: 101 mmol/L (ref 96–106)
Creatinine, Ser: 0.72 mg/dL — ABNORMAL LOW (ref 0.76–1.27)
Glucose: 94 mg/dL (ref 70–99)
Potassium: 4.3 mmol/L (ref 3.5–5.2)
Sodium: 138 mmol/L (ref 134–144)
eGFR: 105 mL/min/{1.73_m2} (ref >59)

## 2021-12-17 LAB — HEPATIC FUNCTION PANEL
ALT: 48 IU/L — ABNORMAL HIGH (ref 0–44)
AST: 32 IU/L (ref 0–40)
Albumin: 4.5 g/dL (ref 3.8–4.9)
Alkaline Phosphatase: 109 IU/L (ref 44–121)
Bilirubin Total: 0.6 mg/dL (ref 0.0–1.2)
Bilirubin, Direct: 0.22 mg/dL (ref 0.00–0.40)
Total Protein: 6.8 g/dL (ref 6.0–8.5)

## 2021-12-17 LAB — PSA: Prostate Specific Ag, Serum: 0.2 ng/mL (ref 0.0–4.0)

## 2021-12-18 ENCOUNTER — Ambulatory Visit (INDEPENDENT_AMBULATORY_CARE_PROVIDER_SITE_OTHER): Payer: 59 | Admitting: Family Medicine

## 2021-12-18 ENCOUNTER — Encounter: Payer: Self-pay | Admitting: Family Medicine

## 2021-12-18 VITALS — BP 134/72 | Ht 72.0 in | Wt 275.8 lb

## 2021-12-18 DIAGNOSIS — F988 Other specified behavioral and emotional disorders with onset usually occurring in childhood and adolescence: Secondary | ICD-10-CM | POA: Diagnosis not present

## 2021-12-18 DIAGNOSIS — L659 Nonscarring hair loss, unspecified: Secondary | ICD-10-CM | POA: Diagnosis not present

## 2021-12-18 DIAGNOSIS — F909 Attention-deficit hyperactivity disorder, unspecified type: Secondary | ICD-10-CM | POA: Diagnosis not present

## 2021-12-18 DIAGNOSIS — Z0000 Encounter for general adult medical examination without abnormal findings: Secondary | ICD-10-CM

## 2021-12-18 DIAGNOSIS — Z0001 Encounter for general adult medical examination with abnormal findings: Secondary | ICD-10-CM

## 2021-12-18 MED ORDER — LISINOPRIL 10 MG PO TABS: ORAL_TABLET | ORAL | 5 refills | Status: DC

## 2021-12-18 MED ORDER — AMPHETAMINE-DEXTROAMPHET ER 10 MG PO CP24: ORAL_CAPSULE | ORAL | 0 refills | Status: DC

## 2021-12-18 MED ORDER — ROSUVASTATIN CALCIUM 40 MG PO TABS: ORAL_TABLET | ORAL | 1 refills | Status: DC

## 2021-12-18 MED ORDER — FINASTERIDE 5 MG PO TABS: 5.0000 mg | ORAL_TABLET | Freq: Every day | ORAL | 1 refills | Status: DC

## 2021-12-18 NOTE — Progress Notes (Signed)
Subjective:    Patient ID: Phillip Spencer, male    DOB: 1960/08/21, 61 y.o.   MRN: [id number]  HPI The patient comes in today for a wellness visit.    A review of their health history was completed.  A review of medications was also completed.  Any needed refills; ADD meds today  Eating habits: good  Falls/  MVA accidents in past few months: none  Regular exercise: yes except for sciatic problem.   Specialist pt sees on regular basis: acupuncture   Preventative health issues were discussed.   Additional concerns: sciatic issues; accupuncture has helped but sciatic pain on left side has limited his activity. Taking lots of ibuprofen, TENS and ice.    Review of Systems     Objective:   Physical Exam General-in no acute distress Eyes-no discharge Lungs-respiratory rate normal, CTA CV-no murmurs,RRR Extremities skin warm dry no edema Neuro grossly normal Behavior normal, alert        Assessment & Plan:  1. Alopecia Has been on this medicine ongoing would like to continue - finasteride (PROSCAR) 5 MG tablet; Take 1 tablet (5 mg total) by mouth daily.  Dispense: 90 tablet; Refill: 1  2. Attention deficit hyperactivity disorder (ADHD), unspecified ADHD type The patient was seen today as part of the visit regarding ADD.  Patient is stable on current regimen.  Appropriate prescriptions prescribed.  Medications were reviewed with the patient as well as compliance. Side effects were checked for. Discussion regarding effectiveness was held. Prescriptions were electronically sent in.  Patient reminded to follow-up in approximately 3 months.   Plans to [facility] law with drug registry was checked and verified while present with the patient. Doing well with medicine takes it every day allows him to focus better refills were sent in - amphetamine-dextroamphetamine (ADDERALL XR) 10 MG 24 hr capsule; TAKE (1) CAPSULE BY MOUTH EVERY DAY  Dispense: 30 capsule; Refill:  0 - amphetamine-dextroamphetamine (ADDERALL XR) 10 MG 24 hr capsule; TAKE (1) CAPSULE BY MOUTH EVERY DAY  Dispense: 30 capsule; Refill: 0 - amphetamine-dextroamphetamine (ADDERALL XR) 10 MG 24 hr capsule; TAKE (1) CAPSULE BY MOUTH EVERY DAY  Dispense: 30 capsule; Refill: 0  3. Well adult exam Adult wellness-complete.wellness physical was conducted today. Importance of diet and exercise were discussed in detail.  Importance of stress reduction and healthy living were discussed.  In addition to this a discussion regarding safety was also covered.  We also reviewed over immunizations and gave recommendations regarding current immunization needed for age.   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 Attention deficit disorder, unspecified hyperactivity presence ADD doing well Refills given Follow-up if progressive troubles The patient was seen today as part of the visit regarding ADD.  Patient is stable on current regimen.  Appropriate prescriptions prescribed.  Medications were reviewed with the patient as well as compliance. Side effects were checked for. Discussion regarding effectiveness was held. Prescriptions were electronically sent in.  Patient reminded to follow-up in approximately 3 months.   Plans to [facility] law with drug registry was checked and verified while present with the patient.   5. Morbid obesity (HCC) portion control regular activity recommended try to bring weight now  Slight elevation of liver enzyme related to fatty liver healthy diet read look at this again within 4 to 6  months

## 2021-12-26 ENCOUNTER — Other Ambulatory Visit: Payer: Self-pay | Admitting: Family Medicine

## 2021-12-26 DIAGNOSIS — E7849 Other hyperlipidemia: Secondary | ICD-10-CM

## 2022-01-27 ENCOUNTER — Other Ambulatory Visit: Payer: Self-pay | Admitting: Family Medicine

## 2022-01-27 DIAGNOSIS — F909 Attention-deficit hyperactivity disorder, unspecified type: Secondary | ICD-10-CM

## 2022-01-27 NOTE — Telephone Encounter (Signed)
Nurses-when the patient was present on his last visit I sent in 3 prescriptions for his ADD medicine pharmacy should still have 2 remaining prescriptions for July and August-please check with them to see if this is so if not we will have to resend-thank you

## 2022-01-28 ENCOUNTER — Telehealth: Payer: Self-pay | Admitting: Family Medicine

## 2022-01-28 NOTE — Telephone Encounter (Signed)
Nurses Please see prescription refills I put a message with the last prescription stating that I had sent in 3 prescriptions on his last visit the all 3 were sent in at the same time so therefore there should be refills on file at the pharmacy.  Please double check with this (It seems rather inefficient for me to send in refills when there should already be some they are-I did send this as a message back to the nurses but it got returned to me as if this message was not seen-please see prescription refill messages thank you) Appreciate your care and following through thank you

## 2022-01-28 NOTE — Telephone Encounter (Signed)
Nurses-please see telephone message-if for some reason the pharmacy does not have these on file let me know we can send an additional refill

## 2022-01-29 NOTE — Telephone Encounter (Signed)
This has already been addressed with the medication refusal for adderall encounter, confirmed that pharmacy filled this for the patient yesterday and has a refill remaining.

## 2022-03-03 ENCOUNTER — Other Ambulatory Visit: Payer: Self-pay | Admitting: Family Medicine

## 2022-03-04 ENCOUNTER — Other Ambulatory Visit: Payer: Self-pay | Admitting: Family Medicine

## 2022-03-04 DIAGNOSIS — F909 Attention-deficit hyperactivity disorder, unspecified type: Secondary | ICD-10-CM

## 2022-03-05 MED ORDER — AMPHETAMINE-DEXTROAMPHET ER 10 MG PO CP24: ORAL_CAPSULE | ORAL | 0 refills | Status: DC

## 2022-03-05 NOTE — Telephone Encounter (Signed)
Patient needs to schedule follow-up visit please somewhere in September early October at the latest

## 2022-03-05 NOTE — Telephone Encounter (Signed)
Patient has been informed per drs orders and recommendations.  

## 2022-04-01 ENCOUNTER — Other Ambulatory Visit: Payer: Self-pay | Admitting: Family Medicine

## 2022-04-01 ENCOUNTER — Encounter: Payer: Self-pay | Admitting: Family Medicine

## 2022-04-01 DIAGNOSIS — E7849 Other hyperlipidemia: Secondary | ICD-10-CM

## 2022-04-01 DIAGNOSIS — F909 Attention-deficit hyperactivity disorder, unspecified type: Secondary | ICD-10-CM

## 2022-04-01 MED ORDER — AMPHETAMINE-DEXTROAMPHET ER 10 MG PO CP24: ORAL_CAPSULE | ORAL | 0 refills | Status: DC

## 2022-04-01 NOTE — Telephone Encounter (Signed)
Hi Phillip Spencer A refill was sent to [facility] in [facility] we will see you at your follow-up visit [facility]-Dr. Nicki Reaper

## 2022-04-03 ENCOUNTER — Ambulatory Visit: Payer: Self-pay | Admitting: Family Medicine

## 2022-04-24 MED ORDER — CHOLESTYRAMINE LIGHT 4 GM/DOSE PO POWD: ORAL | 2 refills | Status: DC

## 2022-05-01 ENCOUNTER — Other Ambulatory Visit: Payer: Self-pay | Admitting: Family Medicine

## 2022-05-01 ENCOUNTER — Ambulatory Visit: Payer: 59 | Admitting: Family Medicine

## 2022-05-01 DIAGNOSIS — E7849 Other hyperlipidemia: Secondary | ICD-10-CM | POA: Diagnosis not present

## 2022-05-01 DIAGNOSIS — F909 Attention-deficit hyperactivity disorder, unspecified type: Secondary | ICD-10-CM

## 2022-05-01 DIAGNOSIS — L659 Nonscarring hair loss, unspecified: Secondary | ICD-10-CM | POA: Diagnosis not present

## 2022-05-01 MED ORDER — FINASTERIDE 5 MG PO TABS: 5.0000 mg | ORAL_TABLET | Freq: Every day | ORAL | 1 refills | Status: DC

## 2022-05-01 MED ORDER — LISINOPRIL 10 MG PO TABS: ORAL_TABLET | ORAL | 5 refills | Status: DC

## 2022-05-01 MED ORDER — ROSUVASTATIN CALCIUM 40 MG PO TABS: ORAL_TABLET | ORAL | 1 refills | Status: DC

## 2022-05-01 MED ORDER — AMPHETAMINE-DEXTROAMPHET ER 10 MG PO CP24: ORAL_CAPSULE | ORAL | 0 refills | Status: DC

## 2022-05-01 MED ORDER — CHOLESTYRAMINE LIGHT 4 GM/DOSE PO POWD: ORAL | 5 refills | Status: DC

## 2022-05-01 NOTE — Progress Notes (Signed)
   Subjective:    Patient ID: Phillip Spencer, male    DOB: [birth year], 61 y.o.   MRN: [id number]  HPI  This patient has adult ADD. Takes medication responsibly. Medication does help the patient focus in be more functional. Patient relates that they are or not abusing the medication or misusing the medication. The patient understands that if they're having any negative side effects such as elevated high blood pressure severe headaches they would need stop the medication follow-up immediately. They also understand that the prescriptions are to last for 3 months then the patient will need to follow-up before having further prescriptions.  Patient compliance QD  Does medication help patient function /attention better  much better  Side effects  none   Review of Systems     Objective:   Physical Exam  General-in no acute distress Eyes-no discharge Lungs-respiratory rate normal, CTA CV-no murmurs,RRR Extremities skin warm dry no edema Neuro grossly normal Behavior normal, alert       Assessment & Plan:  1. Attention deficit hyperactivity disorder (ADHD), unspecified ADHD type The patient was seen today as part of the visit regarding ADD.  Patient is stable on current regimen.  Appropriate prescriptions prescribed.  Medications were reviewed with the patient as well as compliance. Side effects were checked for. Discussion regarding effectiveness was held. Prescriptions were electronically sent in.  Patient reminded to follow-up in approximately 3 months.   Plans to [facility] law with drug registry was checked and verified while present with the patient.  - amphetamine-dextroamphetamine (ADDERALL XR) 10 MG 24 hr capsule; TAKE (1) CAPSULE BY MOUTH EVERY DAY  Dispense: 30 capsule; Refill: 0 - amphetamine-dextroamphetamine (ADDERALL XR) 10 MG 24 hr capsule; TAKE (1) CAPSULE BY MOUTH EVERY DAY  Dispense: 30 capsule; Refill: 0 - amphetamine-dextroamphetamine (ADDERALL XR) 10 MG 24  hr capsule; TAKE (1) CAPSULE BY MOUTH EVERY DAY  Dispense: 30 capsule; Refill: 0  2. Other hyperlipidemia Continue rosuvastatin - cholestyramine light (PREVALITE) 4 GM/DOSE powder; TAKE 1 PACKET (4 G TOTAL) BY MOUTH AT BEDTIME.  Dispense: 693 g; Refill: 5  3. Alopecia Continue medication - finasteride (PROSCAR) 5 MG tablet; Take 1 tablet (5 mg total) by mouth daily.  Dispense: 90 tablet; Refill: 1

## 2022-05-04 MED ORDER — AMPHETAMINE-DEXTROAMPHET ER 10 MG PO CP24: ORAL_CAPSULE | ORAL | 0 refills | Status: DC

## 2022-06-04 ENCOUNTER — Other Ambulatory Visit: Payer: Self-pay | Admitting: Family Medicine

## 2022-06-11 ENCOUNTER — Other Ambulatory Visit: Payer: Self-pay | Admitting: Family Medicine

## 2022-06-11 DIAGNOSIS — F909 Attention-deficit hyperactivity disorder, unspecified type: Secondary | ICD-10-CM

## 2022-06-12 ENCOUNTER — Other Ambulatory Visit: Payer: Self-pay | Admitting: Family Medicine

## 2022-06-12 ENCOUNTER — Other Ambulatory Visit: Payer: Self-pay

## 2022-06-12 MED ORDER — AMPHETAMINE-DEXTROAMPHET ER 10 MG PO CP24: ORAL_CAPSULE | ORAL | 0 refills | Status: DC

## 2022-06-12 NOTE — Telephone Encounter (Signed)
[facility] in [place], has refills on file at other pharmacies also .

## 2022-07-10 ENCOUNTER — Other Ambulatory Visit: Payer: Self-pay | Admitting: Family Medicine

## 2022-07-10 DIAGNOSIS — F909 Attention-deficit hyperactivity disorder, unspecified type: Secondary | ICD-10-CM

## 2022-07-11 MED ORDER — AMPHETAMINE-DEXTROAMPHET ER 10 MG PO CP24: ORAL_CAPSULE | ORAL | 0 refills | Status: DC

## 2022-07-22 ENCOUNTER — Ambulatory Visit: Payer: 59 | Admitting: Family Medicine

## 2022-08-04 ENCOUNTER — Encounter: Payer: Self-pay | Admitting: Family Medicine

## 2022-08-04 DIAGNOSIS — M5430 Sciatica, unspecified side: Secondary | ICD-10-CM

## 2022-08-04 NOTE — Telephone Encounter (Signed)
Nurses Sorry to hear that Phillip Spencer still being bothered by sciatica Please go ahead with referral to [facility] for further evaluation.  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  They can further evaluate his sciatica.  Please share message with Phillip Spencer and go ahead with referral thank you-Dr. Sallee Lange

## 2022-08-13 ENCOUNTER — Other Ambulatory Visit: Payer: Self-pay | Admitting: Family Medicine

## 2022-08-13 DIAGNOSIS — F909 Attention-deficit hyperactivity disorder, unspecified type: Secondary | ICD-10-CM

## 2022-08-13 MED ORDER — AMPHETAMINE-DEXTROAMPHET ER 10 MG PO CP24: ORAL_CAPSULE | ORAL | 0 refills | Status: DC

## 2022-08-21 ENCOUNTER — Ambulatory Visit: Payer: 59 | Admitting: Family Medicine

## 2022-08-21 VITALS — BP 120/84 | Wt 274.0 lb

## 2022-08-21 DIAGNOSIS — I10 Essential (primary) hypertension: Secondary | ICD-10-CM | POA: Diagnosis not present

## 2022-08-21 DIAGNOSIS — E7849 Other hyperlipidemia: Secondary | ICD-10-CM | POA: Diagnosis not present

## 2022-08-21 DIAGNOSIS — F909 Attention-deficit hyperactivity disorder, unspecified type: Secondary | ICD-10-CM

## 2022-08-21 DIAGNOSIS — Z125 Encounter for screening for malignant neoplasm of prostate: Secondary | ICD-10-CM

## 2022-08-21 MED ORDER — AMPHETAMINE-DEXTROAMPHET ER 10 MG PO CP24: ORAL_CAPSULE | ORAL | 0 refills | Status: DC

## 2022-08-21 NOTE — Progress Notes (Signed)
Subjective:    Patient ID: Phillip Spencer, male    DOB: 1961/02/04, 62 y.o.   MRN: [id number]  HPI This patient has adult ADD. Takes medication responsibly. Medication does help the patient focus in be more functional. Patient relates that they are or not abusing the medication or misusing the medication. The patient understands that if they're having any negative side effects such as elevated high blood pressure severe headaches they would need stop the medication follow-up immediately. They also understand that the prescriptions are to last for 3 months then the patient will need to follow-up before having further prescriptions.  Patient compliance good compliance  Does medication help patient function /attention better he states the medicine does help him function at work  Side effects denies side effects with the medicine  Essential hypertension - Plan: Basic Metabolic Panel (7), Microalbumin/Creatinine Ratio, Urine  Attention deficit hyperactivity disorder (ADHD), unspecified ADHD type - Plan: amphetamine-dextroamphetamine (ADDERALL XR) 10 MG 24 hr capsule, amphetamine-dextroamphetamine (ADDERALL XR) 10 MG 24 hr capsule, amphetamine-dextroamphetamine (ADDERALL XR) 10 MG 24 hr capsule  Screening PSA (prostate specific antigen) - Plan: PSA  Other hyperlipidemia - Plan: Lipid panel, Hepatic Function Panel  Patient here for follow-up regarding cholesterol.    Patient relates taking medication on a regular basis Denies problems with medication Importance of dietary measures discussed Regular lab work regarding lipid and liver was checked and if needing additional labs was appropriately ordered  Patient for blood pressure check up.  The patient does have hypertension.   Patient relates dietary measures try to minimize salt The importance of healthy diet and activity were discussed Patient relates compliance  I have encouraged patient to do his lab work before the follow-up  visit   Review of Systems     Objective:   Physical Exam General-in no acute distress Eyes-no discharge Lungs-respiratory rate normal, CTA CV-no murmurs,RRR Extremities skin warm dry no edema Neuro grossly normal Behavior normal, alert  Very patient Working hard with healthy diet regular activity Is losing weight Handling his stress well  Previous labs reviewed no need to do the next set of labs until his next visit      Assessment & Plan:  1. Attention deficit hyperactivity disorder (ADHD), unspecified ADHD type .The patient was seen today as part of the visit regarding ADD.  Patient is stable on current regimen.  Appropriate prescriptions prescribed.  Medications were reviewed with the patient as well as compliance. Side effects were checked for. Discussion regarding effectiveness was held. Prescriptions were electronically sent in.  Patient reminded to follow-up in approximately 3 months.   Plans to [facility] law with drug registry was checked and verified while present with the patient.  - amphetamine-dextroamphetamine (ADDERALL XR) 10 MG 24 hr capsule; TAKE (1) CAPSULE BY MOUTH EVERY DAY  Dispense: 30 capsule; Refill: 0 - amphetamine-dextroamphetamine (ADDERALL XR) 10 MG 24 hr capsule; TAKE (1) CAPSULE BY MOUTH EVERY DAY  Dispense: 30 capsule; Refill: 0 - amphetamine-dextroamphetamine (ADDERALL XR) 10 MG 24 hr capsule; TAKE (1) CAPSULE BY MOUTH EVERY DAY  Dispense: 30 capsule; Refill: 0  2. Essential hypertension HTN- patient seen for follow-up regarding HTN.   Diet, medication compliance, appropriate labs and refills were completed.   Importance of keeping blood pressure under good control to lessen the risk of complications discussed Regular follow-up visits discussed  - Basic Metabolic Panel (7) - Microalbumin/Creatinine Ratio, Urine  3. Screening PSA (prostate specific antigen) Screening PSA before the next visit - PSA  4. Other hyperlipidemia Lab work  continue medication healthy diet - Lipid panel - Hepatic Function Pabsad nel  The patient's BMI is calculated.  The patient does have morbid obesity.  The patient does try to some degree staying active and watching diet.  It is in the vital signs and acknowledged.  It is above the recommended BMI for the patient's height and weight.  The patient has been counseled regarding healthy diet, restricted portions, avoiding excessive carbohydrates/sugary foods, and increase physical activity as health permits.  It is in the patient's best interest to lower the risk of secondary illness including heart disease strokes and cancer by losing weight.  The patient acknowledges this information.

## 2022-09-08 ENCOUNTER — Other Ambulatory Visit: Payer: Self-pay | Admitting: Family Medicine

## 2022-09-10 ENCOUNTER — Other Ambulatory Visit: Payer: Self-pay | Admitting: Family Medicine

## 2022-09-10 DIAGNOSIS — F909 Attention-deficit hyperactivity disorder, unspecified type: Secondary | ICD-10-CM

## 2022-10-06 ENCOUNTER — Other Ambulatory Visit: Payer: Self-pay | Admitting: Family Medicine

## 2022-10-06 DIAGNOSIS — F909 Attention-deficit hyperactivity disorder, unspecified type: Secondary | ICD-10-CM

## 2022-10-08 MED ORDER — AMPHETAMINE-DEXTROAMPHET ER 10 MG PO CP24: ORAL_CAPSULE | ORAL | 0 refills | Status: DC

## 2022-11-06 ENCOUNTER — Other Ambulatory Visit: Payer: Self-pay | Admitting: Family Medicine

## 2022-11-06 DIAGNOSIS — M5416 Radiculopathy, lumbar region: Secondary | ICD-10-CM

## 2022-11-17 ENCOUNTER — Other Ambulatory Visit: Payer: Self-pay | Admitting: Family Medicine

## 2022-11-17 DIAGNOSIS — F909 Attention-deficit hyperactivity disorder, unspecified type: Secondary | ICD-10-CM

## 2022-11-21 ENCOUNTER — Ambulatory Visit
Admission: RE | Admit: 2022-11-21 | Discharge: 2022-11-21 | Disposition: A | Discharge status: Home / Self Care | Payer: 59 | Source: Ambulatory Visit | Attending: Family Medicine | Admitting: Family Medicine

## 2022-11-21 DIAGNOSIS — M5416 Radiculopathy, lumbar region: Secondary | ICD-10-CM

## 2022-11-24 ENCOUNTER — Encounter: Payer: Self-pay | Admitting: Family Medicine

## 2022-11-24 MED ORDER — AMPHETAMINE-DEXTROAMPHET ER 10 MG PO CP24: ORAL_CAPSULE | ORAL | 0 refills | Status: DC

## 2022-12-15 ENCOUNTER — Other Ambulatory Visit: Payer: Self-pay | Admitting: Family Medicine

## 2022-12-23 ENCOUNTER — Encounter: Payer: 59 | Admitting: Family Medicine

## 2022-12-25 ENCOUNTER — Other Ambulatory Visit: Payer: Self-pay | Admitting: Family Medicine

## 2023-01-08 ENCOUNTER — Other Ambulatory Visit: Payer: Self-pay | Admitting: Family Medicine

## 2023-01-10 LAB — LIPID PANEL
Chol/HDL Ratio: 3 ratio (ref 0.0–5.0)
Cholesterol, Total: 237 mg/dL — ABNORMAL HIGH (ref 100–199)
HDL: 79 mg/dL (ref >39)
LDL Chol Calc (NIH): 85 mg/dL (ref 0–99)
Triglycerides: 456 mg/dL — ABNORMAL HIGH (ref 0–149)
VLDL Cholesterol Cal: 73 mg/dL — ABNORMAL HIGH (ref 5–40)

## 2023-01-10 LAB — BASIC METABOLIC PANEL (7)
BUN/Creatinine Ratio: 43 — ABNORMAL HIGH (ref 10–24)
BUN: 27 mg/dL (ref 8–27)
CO2: 26 mmol/L (ref 20–29)
Chloride: 98 mmol/L (ref 96–106)
Creatinine, Ser: 0.63 mg/dL — ABNORMAL LOW (ref 0.76–1.27)
Glucose: 97 mg/dL (ref 70–99)
Potassium: 4.7 mmol/L (ref 3.5–5.2)
Sodium: 138 mmol/L (ref 134–144)
eGFR: 108 mL/min/{1.73_m2} (ref >59)

## 2023-01-10 LAB — MICROALBUMIN / CREATININE URINE RATIO
Creatinine, Urine: 129.2 mg/dL
Microalb/Creat Ratio: 8 mg/g creat (ref 0–29)
Microalbumin, Urine: 10.4 ug/mL

## 2023-01-10 LAB — HEPATIC FUNCTION PANEL
ALT: 29 IU/L (ref 0–44)
AST: 20 IU/L (ref 0–40)
Albumin: 4.2 g/dL (ref 3.9–4.9)
Alkaline Phosphatase: 104 IU/L (ref 44–121)
Bilirubin Total: 0.4 mg/dL (ref 0.0–1.2)
Bilirubin, Direct: 0.13 mg/dL (ref 0.00–0.40)
Total Protein: 6.5 g/dL (ref 6.0–8.5)

## 2023-01-10 LAB — PSA: Prostate Specific Ag, Serum: 0.1 ng/mL (ref 0.0–4.0)

## 2023-01-12 ENCOUNTER — Encounter: Payer: Self-pay | Admitting: Family Medicine

## 2023-01-12 ENCOUNTER — Ambulatory Visit (INDEPENDENT_AMBULATORY_CARE_PROVIDER_SITE_OTHER): Payer: 59 | Admitting: Family Medicine

## 2023-01-12 VITALS — BP 134/86 | HR 64 | Temp 97.5°F | Ht 72.0 in | Wt 252.0 lb

## 2023-01-12 DIAGNOSIS — I10 Essential (primary) hypertension: Secondary | ICD-10-CM | POA: Diagnosis not present

## 2023-01-12 DIAGNOSIS — Z0001 Encounter for general adult medical examination with abnormal findings: Secondary | ICD-10-CM | POA: Diagnosis not present

## 2023-01-12 DIAGNOSIS — F909 Attention-deficit hyperactivity disorder, unspecified type: Secondary | ICD-10-CM | POA: Diagnosis not present

## 2023-01-12 DIAGNOSIS — Z0000 Encounter for general adult medical examination without abnormal findings: Secondary | ICD-10-CM

## 2023-01-12 DIAGNOSIS — E781 Pure hyperglyceridemia: Secondary | ICD-10-CM

## 2023-01-12 DIAGNOSIS — E7849 Other hyperlipidemia: Secondary | ICD-10-CM | POA: Diagnosis not present

## 2023-01-12 DIAGNOSIS — Z125 Encounter for screening for malignant neoplasm of prostate: Secondary | ICD-10-CM

## 2023-01-12 MED ORDER — AMPHETAMINE-DEXTROAMPHET ER 10 MG PO CP24
ORAL_CAPSULE | ORAL | 0 refills | Status: DC
Start: 2023-01-12 — End: 2023-03-26

## 2023-01-12 MED ORDER — ROSUVASTATIN CALCIUM 40 MG PO TABS: ORAL_TABLET | ORAL | 1 refills | Status: DC

## 2023-01-12 MED ORDER — OMEPRAZOLE 20 MG PO CPDR: DELAYED_RELEASE_CAPSULE | ORAL | 3 refills | Status: DC

## 2023-01-12 MED ORDER — AMPHETAMINE-DEXTROAMPHET ER 10 MG PO CP24
ORAL_CAPSULE | ORAL | 0 refills | Status: DC
Start: 2023-01-12 — End: 2023-05-25

## 2023-01-12 MED ORDER — CHOLESTYRAMINE LIGHT 4 GM/DOSE PO POWD
ORAL | 5 refills | Status: DC
Start: 2023-01-12 — End: 2023-05-31

## 2023-01-12 MED ORDER — AMPHETAMINE-DEXTROAMPHET ER 10 MG PO CP24: ORAL_CAPSULE | ORAL | 0 refills | Status: DC

## 2023-01-12 MED ORDER — LISINOPRIL 10 MG PO TABS: ORAL_TABLET | ORAL | 1 refills | Status: DC

## 2023-01-12 NOTE — Progress Notes (Signed)
   Subjective:    Patient ID: Phillip Spencer, male    DOB: [birth year], 62 y.o.   MRN: [id number]  HPI  The patient comes in today for a wellness visit.  He states he is doing well Following a diet plan has lost 70 pounds and is lost almost 40 pounds since last visit  A review of their health history was completed.  A review of medications was also completed.  Any needed refills; all  Eating habits: excellent  Falls/  MVA accidents in past few months: no  Regular exercise: working outdoors, Engineer, site pt sees on regular basis:   Preventative health issues were discussed.   Additional concerns: none , needs tdap  Review of Systems     Objective:   Physical Exam General-in no acute distress Eyes-no discharge Lungs-respiratory rate normal, CTA CV-no murmurs,RRR Extremities skin warm dry no edema Neuro grossly normal Behavior normal, alert Prostate normal Abdomen soft no masses mesh felt patient relates some tenderness       Assessment & Plan:  1. Attention deficit hyperactivity disorder (ADHD), unspecified ADHD type Doing well on medicine Continue this PDMP checked Prescriptions issued  - amphetamine-dextroamphetamine (ADDERALL XR) 10 MG 24 hr capsule; TAKE (1) CAPSULE BY MOUTH EVERY DAY  Dispense: 30 capsule; Refill: 0 - amphetamine-dextroamphetamine (ADDERALL XR) 10 MG 24 hr capsule; TAKE (1) CAPSULE BY MOUTH EVERY DAY  Dispense: 30 capsule; Refill: 0 - amphetamine-dextroamphetamine (ADDERALL XR) 10 MG 24 hr capsule; TAKE (1) CAPSULE BY MOUTH EVERY DAY  Dispense: 30 capsule; Refill: 0  2. Other hyperlipidemia Takes his medication on a regular basis healthy diet - cholestyramine light (PREVALITE) 4 GM/DOSE powder; TAKE 1 PACKET (4 G TOTAL) BY MOUTH AT BEDTIME.  Dispense: 693 g; Refill: 5  3. Essential hypertension HTN- patient seen for follow-up regarding HTN.   Diet, medication compliance, appropriate labs and refills were completed.    Importance of keeping blood pressure under good control to lessen the risk of complications discussed Regular follow-up visits discussed   4. Screening PSA (prostate specific antigen) Recent PSA normal 0.1, prostate exam normal  5.  Obesity Portion control regular physical activity patient is improving  6. Well adult exam Adult wellness-complete.wellness physical was conducted today. Importance of diet and exercise were discussed in detail.  Importance of stress reduction and healthy living were discussed.  In addition to this a discussion regarding safety was also covered.  We also reviewed over immunizations and gave recommendations regarding current immunization needed for age.   In addition to this additional areas were also touched on including: Preventative health exams needed:  Colonoscopy 2028  Patient was advised yearly wellness exam   7. Hypertriglyceridemia Healthy diet, repeat labs in 6 months

## 2023-01-20 ENCOUNTER — Other Ambulatory Visit: Payer: Self-pay | Admitting: Family Medicine

## 2023-01-20 DIAGNOSIS — L659 Nonscarring hair loss, unspecified: Secondary | ICD-10-CM

## 2023-02-09 ENCOUNTER — Telehealth: Payer: Self-pay

## 2023-02-09 DIAGNOSIS — L659 Nonscarring hair loss, unspecified: Secondary | ICD-10-CM

## 2023-02-09 MED ORDER — ROSUVASTATIN CALCIUM 40 MG PO TABS: ORAL_TABLET | ORAL | 0 refills | Status: DC

## 2023-02-09 MED ORDER — FINASTERIDE 5 MG PO TABS
5.0000 mg | ORAL_TABLET | Freq: Every day | ORAL | 0 refills | Status: DC
Start: 2023-02-09 — End: 2023-04-22

## 2023-02-09 NOTE — Telephone Encounter (Signed)
Prescription Request  02/09/2023  LOV: Visit date not found  What is the name of the medication or equipment? rosuvastatin (CRESTOR) 40 MG tablet , finasteride (PROSCAR) 5 MG tablet   Have you contacted your pharmacy to request a refill? Yes   Which pharmacy would you like this sent to? [facility] mail delivery    Patient notified that their request is being sent to the clinical staff for review and that they should receive a response within 2 business days.   Please advise at Mobile [phone] (mobile)

## 2023-02-09 NOTE — Telephone Encounter (Signed)
Received via fax Rx request: Prescription sent electronically to pharmacy  

## 2023-03-23 ENCOUNTER — Other Ambulatory Visit: Payer: Self-pay | Admitting: Family Medicine

## 2023-03-23 DIAGNOSIS — F909 Attention-deficit hyperactivity disorder, unspecified type: Secondary | ICD-10-CM

## 2023-03-26 ENCOUNTER — Other Ambulatory Visit: Payer: Self-pay | Admitting: Family Medicine

## 2023-03-26 DIAGNOSIS — F909 Attention-deficit hyperactivity disorder, unspecified type: Secondary | ICD-10-CM

## 2023-03-26 MED ORDER — AMPHETAMINE-DEXTROAMPHET ER 10 MG PO CP24: ORAL_CAPSULE | ORAL | 0 refills | Status: DC

## 2023-04-22 ENCOUNTER — Other Ambulatory Visit: Payer: Self-pay | Admitting: Family Medicine

## 2023-04-22 DIAGNOSIS — L659 Nonscarring hair loss, unspecified: Secondary | ICD-10-CM

## 2023-04-22 DIAGNOSIS — F909 Attention-deficit hyperactivity disorder, unspecified type: Secondary | ICD-10-CM

## 2023-04-22 MED ORDER — ROSUVASTATIN CALCIUM 40 MG PO TABS: ORAL_TABLET | ORAL | 1 refills | Status: DC

## 2023-04-22 MED ORDER — FINASTERIDE 5 MG PO TABS: 5.0000 mg | ORAL_TABLET | Freq: Every day | ORAL | 1 refills | Status: DC

## 2023-04-23 MED ORDER — AMPHETAMINE-DEXTROAMPHET ER 10 MG PO CP24
ORAL_CAPSULE | ORAL | 0 refills | Status: DC
Start: 2023-04-23 — End: 2023-05-25

## 2023-04-27 ENCOUNTER — Other Ambulatory Visit: Payer: Self-pay | Admitting: Family Medicine

## 2023-04-27 MED ORDER — LISINOPRIL 10 MG PO TABS: ORAL_TABLET | ORAL | 1 refills | Status: DC

## 2023-05-25 ENCOUNTER — Ambulatory Visit: Payer: 59 | Admitting: Family Medicine

## 2023-05-25 ENCOUNTER — Encounter: Payer: Self-pay | Admitting: Family Medicine

## 2023-05-25 DIAGNOSIS — L659 Nonscarring hair loss, unspecified: Secondary | ICD-10-CM

## 2023-05-25 DIAGNOSIS — F909 Attention-deficit hyperactivity disorder, unspecified type: Secondary | ICD-10-CM | POA: Diagnosis not present

## 2023-05-25 MED ORDER — AMPHETAMINE-DEXTROAMPHET ER 10 MG PO CP24
ORAL_CAPSULE | ORAL | 0 refills | Status: DC
Start: 2023-05-25 — End: 2023-06-24

## 2023-05-25 NOTE — Progress Notes (Signed)
Subjective:    Patient ID: Phillip Spencer, male    DOB: March 22, 1961, 62 y.o.   MRN: [id number]  Discussed the use of AI scribe software for clinical note transcription with the patient, who gave verbal consent to proceed.  History of Present Illness   The patient, a busy professional with a demanding schedule, presents with a chief complaint of persistent sciatic nerve pain. The pain has been ongoing, described as constant and significantly impacting his daily activities, including walking and sitting. The patient reports that the pain has been mentally distressing due to its persistent nature and the limitations it imposes on his lifestyle.  The patient has been undergoing treatment for the sciatic pain, which includes gabapentin and two rounds of injections. The patient reports a possible improvement with gabapentin, although he expresses uncertainty about the effectiveness of the injections. He has been gradually increasing the dosage of gabapentin under the guidance of his healthcare provider, currently at three times a day. The patient reports mild drowsiness as a side effect of the medication but otherwise tolerates it well.  In addition to pharmacological interventions, the patient has also undergone physical therapy. However, he discontinued it as he did not perceive significant improvement. He learned some positioning techniques during therapy, which he continues to use. The patient also uses a pressure point brace, which provides some relief but not significantly. He reports that sitting exacerbates the pain, and walking is also challenging due to the discomfort.  The patient's lifestyle is highly active, with long workdays and involvement in various activities outside of work. He reports some weight gain and acknowledges the need to make better dietary choices. He expresses difficulty in maintaining a healthy diet due to his busy schedule and frequent meetings. The patient is also on  ADD medication, which he takes daily and reports satisfaction with its effects.  Despite the challenges posed by the sciatic pain, the patient is determined to continue seeking treatment until a resolution is found. He expresses a strong desire to return to his normal level of activity and is scheduled for another round of injections in the near future.         Review of Systems     Objective:    Physical Exam   VITALS: BP- 122/74    General-in no acute distress Eyes-no discharge Lungs-respiratory rate normal, CTA CV-no murmurs,RRR Extremities skin warm dry no edema Neuro grossly normal Behavior normal, alert        Assessment & Plan:  Assessment and Plan    Sciatica Persistent pain despite physical therapy, two rounds of injections, and gabapentin. Gabapentin titrated up to three times a day with some side effects of drowsiness. MRI shows no structural abnormalities that would benefit from surgical intervention. -Continue gabapentin three times a day. -Consider further titration of gabapentin dose, balancing symptom improvement and side effects. -Continue with planned injections and follow-up with specialist. -Provide updates to primary care provider regarding progress and any concerns.  Attention Deficit Disorder Stable on current medication regimen. -Continue current medication. -Send three prescriptions to [facility] for ADD medication. -Contact primary care provider for fourth prescription in four months.  General Health Maintenance Discussed diet and exercise. Noted difficulty with exercise due to sciatica and poor dietary choices due to busy schedule. -Consider meal prepping to improve dietary choices. -Explore exercises that are tolerable with sciatica, such as swimming. -Follow-up in approximately three to four months.      1. Attention deficit hyperactivity disorder (ADHD), unspecified ADHD  type The patient was seen today as part of the visit regarding ADD.   Patient is stable on current regimen.  Appropriate prescriptions prescribed.  Medications were reviewed with the patient as well as compliance. Side effects were checked for. Discussion regarding effectiveness was held. Prescriptions were electronically sent in.  Patient reminded to follow-up in approximately 3 months.   Plans to [facility] law with drug registry was checked and verified while present with the patient.  - amphetamine-dextroamphetamine (ADDERALL XR) 10 MG 24 hr capsule; TAKE (1) CAPSULE BY MOUTH EVERY DAY  Dispense: 30 capsule; Refill: 0 - amphetamine-dextroamphetamine (ADDERALL XR) 10 MG 24 hr capsule; TAKE (1) CAPSULE BY MOUTH EVERY DAY  Dispense: 30 capsule; Refill: 0 - amphetamine-dextroamphetamine (ADDERALL XR) 10 MG 24 hr capsule; TAKE (1) CAPSULE BY MOUTH EVERY DAY  Dispense: 30 capsule; Refill: 0  Patient does have sciatica under the care of a specialist May continue with gabapentin but be cautious of drowsiness   No labs indicated Doing well with his blood pressure Patient work harder on diet and regular activity

## 2023-05-31 ENCOUNTER — Other Ambulatory Visit: Payer: Self-pay | Admitting: Family Medicine

## 2023-05-31 DIAGNOSIS — E7849 Other hyperlipidemia: Secondary | ICD-10-CM

## 2023-05-31 MED ORDER — OMEPRAZOLE 20 MG PO CPDR: DELAYED_RELEASE_CAPSULE | ORAL | 3 refills | Status: DC

## 2023-05-31 MED ORDER — CHOLESTYRAMINE LIGHT 4 GM/DOSE PO POWD: ORAL | 5 refills | Status: DC

## 2023-06-02 ENCOUNTER — Encounter: Payer: Self-pay | Admitting: Family Medicine

## 2023-06-22 ENCOUNTER — Other Ambulatory Visit: Payer: Self-pay | Admitting: Nurse Practitioner

## 2023-06-22 DIAGNOSIS — E7849 Other hyperlipidemia: Secondary | ICD-10-CM

## 2023-06-22 MED ORDER — CHOLESTYRAMINE LIGHT 4 GM/DOSE PO POWD
ORAL | 0 refills | Status: DC
Start: 2023-06-22 — End: 2023-06-24

## 2023-06-24 ENCOUNTER — Other Ambulatory Visit: Payer: Self-pay | Admitting: Family Medicine

## 2023-06-24 ENCOUNTER — Encounter: Payer: Self-pay | Admitting: Family Medicine

## 2023-06-24 ENCOUNTER — Other Ambulatory Visit: Payer: Self-pay | Admitting: Nurse Practitioner

## 2023-06-24 DIAGNOSIS — E7849 Other hyperlipidemia: Secondary | ICD-10-CM

## 2023-06-24 DIAGNOSIS — F909 Attention-deficit hyperactivity disorder, unspecified type: Secondary | ICD-10-CM

## 2023-06-24 MED ORDER — AMPHETAMINE-DEXTROAMPHET ER 10 MG PO CP24: ORAL_CAPSULE | ORAL | 0 refills | Status: DC

## 2023-06-24 MED ORDER — CHOLESTYRAMINE LIGHT 4 GM/DOSE PO POWD: ORAL | 0 refills | Status: DC

## 2023-07-07 ENCOUNTER — Other Ambulatory Visit: Payer: Self-pay | Admitting: Family Medicine

## 2023-07-07 DIAGNOSIS — E7849 Other hyperlipidemia: Secondary | ICD-10-CM

## 2023-07-07 MED ORDER — CHOLESTYRAMINE LIGHT 4 GM/DOSE PO POWD: ORAL | 11 refills | Status: DC

## 2023-07-28 ENCOUNTER — Encounter: Payer: Self-pay | Admitting: Family Medicine

## 2023-07-28 ENCOUNTER — Other Ambulatory Visit: Payer: Self-pay | Admitting: Family Medicine

## 2023-07-28 DIAGNOSIS — F909 Attention-deficit hyperactivity disorder, unspecified type: Secondary | ICD-10-CM

## 2023-08-25 ENCOUNTER — Other Ambulatory Visit: Payer: Self-pay | Admitting: Family Medicine

## 2023-08-25 DIAGNOSIS — F909 Attention-deficit hyperactivity disorder, unspecified type: Secondary | ICD-10-CM

## 2023-08-25 MED ORDER — AMPHETAMINE-DEXTROAMPHET ER 10 MG PO CP24: ORAL_CAPSULE | ORAL | 0 refills | Status: DC

## 2023-09-22 ENCOUNTER — Ambulatory Visit: Payer: 59 | Admitting: Family Medicine

## 2023-09-22 VITALS — BP 134/82 | HR 75 | Temp 98.6°F | Ht 72.0 in | Wt 299.4 lb

## 2023-09-22 DIAGNOSIS — F909 Attention-deficit hyperactivity disorder, unspecified type: Secondary | ICD-10-CM

## 2023-09-22 DIAGNOSIS — M5430 Sciatica, unspecified side: Secondary | ICD-10-CM

## 2023-09-22 DIAGNOSIS — I10 Essential (primary) hypertension: Secondary | ICD-10-CM

## 2023-09-22 DIAGNOSIS — L659 Nonscarring hair loss, unspecified: Secondary | ICD-10-CM | POA: Diagnosis not present

## 2023-09-22 DIAGNOSIS — E7849 Other hyperlipidemia: Secondary | ICD-10-CM

## 2023-09-22 MED ORDER — AMPHETAMINE-DEXTROAMPHET ER 10 MG PO CP24: ORAL_CAPSULE | ORAL | 0 refills | Status: DC

## 2023-09-22 MED ORDER — LISINOPRIL 10 MG PO TABS: ORAL_TABLET | ORAL | 1 refills | Status: DC

## 2023-09-22 MED ORDER — FINASTERIDE 5 MG PO TABS: 5.0000 mg | ORAL_TABLET | Freq: Every day | ORAL | 1 refills | Status: DC

## 2023-09-22 MED ORDER — ROSUVASTATIN CALCIUM 40 MG PO TABS: ORAL_TABLET | ORAL | 1 refills | Status: DC

## 2023-09-22 NOTE — Progress Notes (Signed)
 Subjective:    Patient ID: Phillip Spencer, male    DOB: 10-Apr-1961, 63 y.o.   MRN: [id number]  HPI This patient has adult ADD. Takes medication responsibly. Medication does help the patient focus in be more functional. Patient relates that they are or not abusing the medication or misusing the medication. The patient understands that if they're having any negative side effects such as elevated high blood pressure severe headaches they would need stop the medication follow-up immediately. They also understand that the prescriptions are to last for 3 months then the patient will need to follow-up before having further prescriptions.  Patient compliance doing well with medicine takes it every day  Does medication help patient function /attention better he states it helps him focus and stay attentive  Side effects he denies any side effects  He is trying to work hard on dietary measures to get his weight back down Patient for blood pressure check up.  The patient does have hypertension.   Patient relates dietary measures try to minimize salt The importance of healthy diet and activity were discussed Patient relates compliance  Patient here for follow-up regarding cholesterol.    Patient relates taking medication on a regular basis Denies problems with medication Importance of dietary measures discussed Regular lab work regarding lipid and liver was checked and if needing additional labs was appropriately ordered     Review of Systems     Objective:   Physical Exam  General-in no acute distress Eyes-no discharge Lungs-respiratory rate normal, CTA CV-no murmurs,RRR Extremities skin warm dry no edema Neuro grossly normal Behavior normal, alert       Assessment & Plan:  1. Attention deficit hyperactivity disorder (ADHD), unspecified ADHD type (Primary) Medication sent in The patient was seen today as part of the visit regarding ADD.  Patient is stable on current  regimen.  Appropriate prescriptions prescribed.  Medications were reviewed with the patient as well as compliance. Side effects were checked for. Discussion regarding effectiveness was held. Prescriptions were electronically sent in.  Patient reminded to follow-up in approximately 3 months.   Plans to [facility] law with drug registry was checked and verified while present with the patient.  - amphetamine-dextroamphetamine (ADDERALL XR) 10 MG 24 hr capsule; TAKE (1) CAPSULE BY MOUTH EVERY DAY  Dispense: 30 capsule; Refill: 0 - amphetamine-dextroamphetamine (ADDERALL XR) 10 MG 24 hr capsule; TAKE (1) CAPSULE BY MOUTH EVERY DAY  Dispense: 30 capsule; Refill: 0 - amphetamine-dextroamphetamine (ADDERALL XR) 10 MG 24 hr capsule; TAKE (1) CAPSULE BY MOUTH EVERY DAY  Dispense: 30 capsule; Refill: 0  2. Alopecia Takes medication on a regular basis does well with it - finasteride (PROSCAR) 5 MG tablet; Take 1 tablet (5 mg total) by mouth daily.  Dispense: 90 tablet; Refill: 1  3. Essential hypertension HTN- patient seen for follow-up regarding HTN.   Diet, medication compliance, appropriate labs and refills were completed.   Importance of keeping blood pressure under good control to lessen the risk of complications discussed Regular follow-up visits discussed   4. Sciatica, unspecified laterality Is on gabapentin sees a specialist in 10 days they are contemplating more injections and possible adjustment of his gabapentin no weakness on today's exam  5. Other hyperlipidemia Hyperlipidemia-importance of diet, weight control, activity, compliance with medications discussed.   Recent labs reviewed.   Any additional labs or refills ordered.   Importance of keeping under good control discussed. Regular follow-up visits discussed  Follow-up July wellness and ADD Patient  will do lab work before his follow-up visit

## 2023-09-23 ENCOUNTER — Other Ambulatory Visit: Payer: Self-pay

## 2023-09-23 DIAGNOSIS — I10 Essential (primary) hypertension: Secondary | ICD-10-CM

## 2023-09-23 DIAGNOSIS — E7849 Other hyperlipidemia: Secondary | ICD-10-CM

## 2023-09-23 DIAGNOSIS — Z79899 Other long term (current) drug therapy: Secondary | ICD-10-CM

## 2023-09-23 DIAGNOSIS — Z125 Encounter for screening for malignant neoplasm of prostate: Secondary | ICD-10-CM

## 2023-09-28 ENCOUNTER — Other Ambulatory Visit: Payer: Self-pay | Admitting: Family Medicine

## 2023-10-03 ENCOUNTER — Other Ambulatory Visit: Payer: Self-pay | Admitting: Family Medicine

## 2023-10-03 DIAGNOSIS — L659 Nonscarring hair loss, unspecified: Secondary | ICD-10-CM

## 2023-10-04 ENCOUNTER — Encounter: Payer: Self-pay | Admitting: Family Medicine

## 2023-10-05 ENCOUNTER — Other Ambulatory Visit: Payer: Self-pay

## 2023-10-05 DIAGNOSIS — M5430 Sciatica, unspecified side: Secondary | ICD-10-CM

## 2023-10-05 NOTE — Telephone Encounter (Signed)
 Nurses Go ahead with referral to Dr. Mariah Milling in [place] as requested  She is a physiatrist who can work with this type of problem perhaps be able to get him better relief.  Please also send [name] a message letting him know that this referral has been placed  The referral team will send the information to Dr. Patrcia Dolly as then their office should reach out to [place] [place] to schedule an appointment.  If he has not heard anything within the next 2 weeks he should give Dr. Mariah Milling office a call If any issues let us know  Thanks-Ernesteen Mihalic

## 2023-10-13 ENCOUNTER — Other Ambulatory Visit: Payer: Self-pay | Admitting: Family Medicine

## 2023-10-22 ENCOUNTER — Other Ambulatory Visit: Payer: Self-pay | Admitting: Family Medicine

## 2023-11-12 ENCOUNTER — Other Ambulatory Visit: Payer: Self-pay | Admitting: Family Medicine

## 2023-11-12 DIAGNOSIS — F909 Attention-deficit hyperactivity disorder, unspecified type: Secondary | ICD-10-CM

## 2023-11-17 ENCOUNTER — Other Ambulatory Visit: Payer: Self-pay | Admitting: Family Medicine

## 2023-11-17 DIAGNOSIS — F909 Attention-deficit hyperactivity disorder, unspecified type: Secondary | ICD-10-CM

## 2023-11-17 MED ORDER — AMPHETAMINE-DEXTROAMPHET ER 10 MG PO CP24: ORAL_CAPSULE | ORAL | 0 refills | Status: DC

## 2024-01-05 ENCOUNTER — Other Ambulatory Visit: Payer: Self-pay | Admitting: Family Medicine

## 2024-01-05 ENCOUNTER — Encounter: Payer: Self-pay | Admitting: Family Medicine

## 2024-01-05 DIAGNOSIS — F909 Attention-deficit hyperactivity disorder, unspecified type: Secondary | ICD-10-CM

## 2024-01-05 MED ORDER — AMPHETAMINE-DEXTROAMPHET ER 10 MG PO CP24
ORAL_CAPSULE | ORAL | 0 refills | Status: DC
Start: 2024-01-05 — End: 2024-01-21

## 2024-01-20 ENCOUNTER — Ambulatory Visit: Payer: Self-pay | Admitting: Family Medicine

## 2024-01-20 LAB — HEPATIC FUNCTION PANEL
ALT: 35 IU/L (ref 0–44)
AST: 35 IU/L (ref 0–40)
Albumin: 4 g/dL (ref 3.9–4.9)
Alkaline Phosphatase: 117 IU/L (ref 44–121)
Bilirubin Total: 0.5 mg/dL (ref 0.0–1.2)
Bilirubin, Direct: 0.23 mg/dL (ref 0.00–0.40)
Total Protein: 6.4 g/dL (ref 6.0–8.5)

## 2024-01-20 LAB — LIPID PANEL
Chol/HDL Ratio: 2.5 ratio (ref 0.0–5.0)
Cholesterol, Total: 175 mg/dL (ref 100–199)
HDL: 70 mg/dL (ref >39)
LDL Chol Calc (NIH): 87 mg/dL (ref 0–99)
Triglycerides: 101 mg/dL (ref 0–149)
VLDL Cholesterol Cal: 18 mg/dL (ref 5–40)

## 2024-01-20 LAB — BASIC METABOLIC PANEL WITH GFR
BUN/Creatinine Ratio: 20 (ref 10–24)
BUN: 12 mg/dL (ref 8–27)
CO2: 24 mmol/L (ref 20–29)
Calcium: 9.4 mg/dL (ref 8.6–10.2)
Chloride: 104 mmol/L (ref 96–106)
Creatinine, Ser: 0.61 mg/dL — ABNORMAL LOW (ref 0.76–1.27)
Glucose: 105 mg/dL — ABNORMAL HIGH (ref 70–99)
Potassium: 5.2 mmol/L (ref 3.5–5.2)
Sodium: 143 mmol/L (ref 134–144)
eGFR: 109 mL/min/1.73 (ref >59)

## 2024-01-20 LAB — MICROALBUMIN / CREATININE URINE RATIO
Creatinine, Urine: 129.4 mg/dL
Microalb/Creat Ratio: 8 mg/g{creat} (ref 0–29)
Microalbumin, Urine: 10.3 ug/mL

## 2024-01-20 LAB — PSA: Prostate Specific Ag, Serum: 0.2 ng/mL (ref 0.0–4.0)

## 2024-01-21 ENCOUNTER — Encounter: Payer: Self-pay | Admitting: Family Medicine

## 2024-01-21 ENCOUNTER — Ambulatory Visit: Admitting: Family Medicine

## 2024-01-21 VITALS — BP 126/78 | HR 67 | Temp 98.4°F | Ht 72.0 in | Wt 308.6 lb

## 2024-01-21 DIAGNOSIS — Z0001 Encounter for general adult medical examination with abnormal findings: Secondary | ICD-10-CM

## 2024-01-21 DIAGNOSIS — Z0000 Encounter for general adult medical examination without abnormal findings: Secondary | ICD-10-CM

## 2024-01-21 DIAGNOSIS — M5432 Sciatica, left side: Secondary | ICD-10-CM

## 2024-01-21 DIAGNOSIS — F909 Attention-deficit hyperactivity disorder, unspecified type: Secondary | ICD-10-CM | POA: Diagnosis not present

## 2024-01-21 MED ORDER — AMPHETAMINE-DEXTROAMPHET ER 10 MG PO CP24: ORAL_CAPSULE | ORAL | 0 refills | Status: DC

## 2024-01-21 NOTE — Progress Notes (Signed)
 Subjective:    Patient ID: Phillip Spencer, male    DOB: 1960-12-22, 63 y.o.   MRN: [id number]  HPI The patient comes in today for a wellness visit.    A review of their health history was completed.  A review of medications was also completed.  Any needed refills; Everything ([employer]umn 90 day supplies)  Eating habits: Poor  Falls/  MVA accidents in past few months: no  Regular exercise: No  Specialist pt sees on regular basis: Dr Darrall Neurosurgery  Preventative health issues were discussed.   Additional concerns: None at this time  This patient has adult ADD. Takes medication responsibly. Medication does help the patient focus in be more functional. Patient relates that they are or not abusing the medication or misusing the medication. The patient understands that if they're having any negative side effects such as elevated high blood pressure severe headaches they would need stop the medication follow-up immediately. They also understand that the prescriptions are to last for 3 months then the patient will need to follow-up before having further prescriptions.  Patient compliance good compliance  Does medication help patient function /attention better does help him stay more focused  Side effects denies side effects  It should also be noted that patient is having significant sciatica down his leg causes pain discomfort he is gone through multiple injections he has had MRIs he has been on gabapentin unfortunately he has severe pain with this and it is causing him to limp and have difficult time getting in and out of his car and some difficulty going up and down steps and avoiding any long walking whatsoever Review of Systems     Objective:   Physical Exam General-in no acute distress Eyes-no discharge Lungs-respiratory rate normal, CTA CV-no murmurs,RRR Extremities skin warm dry no edema Neuro grossly normal Behavior normal, alert Prostate deferred shared  discussion  Lab work looks good see labs PSA normal.this Results for orders placed or performed in visit on 09/23/23  Basic Metabolic Panel   Collection Time: 01/19/24 10:21 AM  Result Value Ref Range   Glucose 105 (H) 70 - 99 mg/dL   BUN 12 8 - 27 mg/dL   Creatinine, Ser 9.38 (L) 0.76 - 1.27 mg/dL   eGFR 890 >40 fO/fpw/8.26   BUN/Creatinine Ratio 20 10 - 24   Sodium 143 134 - 144 mmol/L   Potassium 5.2 3.5 - 5.2 mmol/L   Chloride 104 96 - 106 mmol/L   CO2 24 20 - 29 mmol/L   Calcium  9.4 8.6 - 10.2 mg/dL  Lipid Panel   Collection Time: 01/19/24 10:21 AM  Result Value Ref Range   Cholesterol, Total 175 100 - 199 mg/dL   Triglycerides 898 0 - 149 mg/dL   HDL 70 >60 mg/dL   VLDL Cholesterol Cal 18 5 - 40 mg/dL   LDL Chol Calc (NIH) 87 0 - 99 mg/dL   Chol/HDL Ratio 2.5 0.0 - 5.0 ratio  Hepatic Function Panel   Collection Time: 01/19/24 10:21 AM  Result Value Ref Range   Total Protein 6.4 6.0 - 8.5 g/dL   Albumin 4.0 3.9 - 4.9 g/dL   Bilirubin Total 0.5 0.0 - 1.2 mg/dL   Bilirubin, Direct 9.76 0.00 - 0.40 mg/dL   Alkaline Phosphatase 117 44 - 121 IU/L   AST 35 0 - 40 IU/L   ALT 35 0 - 44 IU/L  PSA   Collection Time: 01/19/24 10:21 AM  Result Value Ref Range  Prostate Specific Ag, Serum 0.2 0.0 - 4.0 ng/mL  Microalbumin/Creatinine Ratio, Urine   Collection Time: 01/19/24 10:21 AM  Result Value Ref Range   Creatinine, Urine 129.4 Not Estab. mg/dL   Microalbumin, Urine 89.6 Not Estab. ug/mL   Microalb/Creat Ratio 8 0 - 29 mg/g creat        Assessment & Plan:   1. Well adult exam (Primary) Adult wellness-complete.wellness physical was conducted today. Importance of diet and exercise were discussed in detail.  Importance of stress reduction and healthy living were discussed.  In addition to this a discussion regarding safety was also covered.  We also reviewed over immunizations and gave recommendations regarding current immunization needed for age.   In addition to  this additional areas were also touched on including: Preventative health exams needed:  Colonoscopy 2028  Patient was advised yearly wellness exam   2. Attention deficit hyperactivity disorder (ADHD), unspecified ADHD type Doing well with medicines continue these 3 scripts given The patient was seen today as part of the visit regarding ADD.  Patient is stable on current regimen.  Appropriate prescriptions prescribed.  Medications were reviewed with the patient as well as compliance. Side effects were checked for. Discussion regarding effectiveness was held. Prescriptions were electronically sent in.  Patient reminded to follow-up in approximately 3 months.   Plans to [place]  law with drug registry was checked and verified while present with the patient.  - amphetamine -dextroamphetamine  (ADDERALL XR) 10 MG 24 hr capsule; TAKE (1) CAPSULE BY MOUTH EVERY DAY  Dispense: 30 capsule; Refill: 0 - amphetamine -dextroamphetamine  (ADDERALL XR) 10 MG 24 hr capsule; TAKE (1) CAPSULE BY MOUTH EVERY DAY  Dispense: 30 capsule; Refill: 0 - amphetamine -dextroamphetamine  (ADDERALL XR) 10 MG 24 hr capsule; TAKE (1) CAPSULE BY MOUTH EVERY DAY  Dispense: 30 capsule; Refill: 0  3. Sciatica, left side Significant sciatica left side recommend referral to neurosurgery patient states he will get the MRI and take it with him from [facility] I believe the patient needs a better game plan than just injections every few months hopefully neurosurgeon can give him some guidance on what is the proper path  Continue gabapentin for now it is not causing drowsiness if he needs a refill he will let us  know he relates he is doing 300 mg 3 capsules 3 times daily Patient to do follow-up in 4 months

## 2024-01-27 ENCOUNTER — Other Ambulatory Visit: Payer: Self-pay

## 2024-01-27 DIAGNOSIS — M5430 Sciatica, unspecified side: Secondary | ICD-10-CM

## 2024-01-27 DIAGNOSIS — M5432 Sciatica, left side: Secondary | ICD-10-CM

## 2024-02-10 ENCOUNTER — Other Ambulatory Visit: Payer: Self-pay | Admitting: Family Medicine

## 2024-02-10 ENCOUNTER — Inpatient Hospital Stay
Admission: RE | Admit: 2024-02-10 | Discharge: 2024-02-10 | Disposition: A | Discharge status: Home / Self Care | Payer: Self-pay | Source: Ambulatory Visit | Attending: Neurosurgery | Admitting: Neurosurgery

## 2024-02-10 DIAGNOSIS — Z049 Encounter for examination and observation for unspecified reason: Secondary | ICD-10-CM

## 2024-02-15 NOTE — Progress Notes (Unsigned)
 Referring Physician:  Alphonsa Spencer LABOR, MD [address] [place],  [place] [address]  Primary Physician:  Alphonsa Spencer LABOR, MD  History of Present Illness: 02/18/2024 Phillip Spencer is here today with a chief complaint of chronic back and leg pain. He was referred by Dr. Alphonsa for evaluation of his leg pain.  He experiences chronic back and leg pain, with the legs being more affected. This pain significantly impacts his daily activities. He takes 2700 mg of gabapentin daily, which provides some relief. He has undergone four rounds of injections, each providing only two days of relief. Physical therapy is beneficial, with the current therapist being more effective.  He experiences tingling and numbness in the bottom of his feet and numbness in his shin. Weakness and atrophy in his leg were first noticed about a year ago, affecting his ability to walk long distances without fatigue. He can stand without issue. He wears a knee brace with a pressure point disc for support, used for about two years. No numbness in other areas except for the specified regions.  He finds it difficult to maintain physical activity due to his pain, which limits his outdoor activities.  Standing and walking make his pain worse.   Bowel/Bladder Dysfunction: none  Conservative measures:  Physical therapy: Currently doing PT at [facility], he has been to about 6 visits Multimodal medical therapy including regular antiinflammatories: Gabapentin, Ibuprofen , Tylenol  Injections:  12/29/2022--ESI L4-5  04/01/2023--ESI left L5-S1 transforaminal 06/22/2023--ESI lumbar 10/01/2023- ESI lumbar 01/04/2024- ESI lumbar  Past Surgery: no spine surgery  Phillip Spencer Feeling has no symptoms of cervical myelopathy.  The symptoms are causing a significant impact on the patient's life.   I have utilized the care everywhere function in epic to review the outside records available from external health  systems.  Review of Systems:  A 10 point review of systems is negative, except for the pertinent positives and negatives detailed in the HPI.  Past Medical History: Past Medical History:  Diagnosis Date   Barrett's esophagus determined by biopsy 09/15/2021   [facility] February 2023 they recommend follow-up EGD in 5 years   Colon polyp 09/15/2021   Had colonoscopy with [facility].  They recommend follow-up colonoscopy in 5 years   GERD (gastroesophageal reflux disease)    Hematuria    Hyperlipidemia    Hypertension    Pre-diabetes    Renal calculus, right     Past Surgical History: Past Surgical History:  Procedure Laterality Date   CHOLECYSTECTOMY  2011   CYSTOSCOPY WITH RETROGRADE PYELOGRAM, URETEROSCOPY AND STENT PLACEMENT Right 09/06/2014   Procedure: CYSTOSCOPY WITH RETROGRADE PYELOGRAM, RIGHT DIAGNOSTIC URETEROSCOPY AND STENT PLACEMENT;  Surgeon: Ricardo Likens, MD;  Location: [facility];  Service: Urology;  Laterality: Right;   CYSTOSCOPY WITH RETROGRADE PYELOGRAM, URETEROSCOPY AND STENT PLACEMENT Right 09/27/2014   Procedure: CYSTOSCOPY WITH RETROGRADE PYELOGRAM, RIGHT URETEROSCOPY AND STENT EXCHANGE, LASER LITHOTRIPSY WITH STONE BASKETRY;  Surgeon: Ricardo Likens, MD;  Location: [facility];  Service: Urology;  Laterality: Right;   FOOT SURGERY Bilateral    HERNIA REPAIR     ventral x 2   HOLMIUM LASER APPLICATION Right 09/27/2014   Procedure: HOLMIUM LASER APPLICATION;  Surgeon: Ricardo Likens, MD;  Location: [facility];  Service: Urology;  Laterality: Right;   KNEE ARTHROSCOPY Right    NASAL SINUS SURGERY      Allergies: Allergies as of 02/18/2024 - Review Complete 01/21/2024  Allergen Reaction Noted  Amoxicillin Nausea And Vomiting, Nausea Only, and Other (See Comments) 09/01/2014    Medications:  Current Outpatient Medications:    amphetamine -dextroamphetamine  (ADDERALL XR) 10 MG 24 hr capsule,  TAKE (1) CAPSULE BY MOUTH EVERY DAY, Disp: 30 capsule, Rfl: 0   amphetamine -dextroamphetamine  (ADDERALL XR) 10 MG 24 hr capsule, TAKE (1) CAPSULE BY MOUTH EVERY DAY, Disp: 30 capsule, Rfl: 0   amphetamine -dextroamphetamine  (ADDERALL XR) 10 MG 24 hr capsule, TAKE (1) CAPSULE BY MOUTH EVERY DAY, Disp: 30 capsule, Rfl: 0   aspirin EC 81 MG tablet, Take 81 mg by mouth daily., Disp: , Rfl:    cholestyramine  light (PREVALITE ) 4 GM/DOSE powder, TAKE 1 PACKET (4 G TOTAL) BY MOUTH AT BEDTIME., Disp: 231 g, Rfl: 11   Coenzyme Q10 (CO Q-10) 200 MG CAPS, Take 200 mg by mouth daily. , Disp: , Rfl:    finasteride  (PROSCAR ) 5 MG tablet, Take 1 tablet (5 mg total) by mouth daily., Disp: 90 tablet, Rfl: 1   gabapentin (NEURONTIN) 300 MG capsule, Take 900 mg by mouth 3 (three) times daily., Disp: , Rfl:    ibuprofen  (ADVIL ) 200 MG tablet, Take 400-800 mg by mouth every 6 (six) hours as needed for fever, mild pain or moderate pain., Disp: , Rfl:    lisinopril  (ZESTRIL ) 10 MG tablet, TAKE 1 TABLET BY MOUTH DAILY, Disp: 90 tablet, Rfl: 3   Multiple Vitamins-Minerals (PRESERVISION AREDS) CAPS, Take 1-2 capsules by mouth daily., Disp: , Rfl:    Nutritional Supplements (JUICE PLUS FIBRE PO), Take 3 capsules by mouth at bedtime. 1- Veggies, 1- Fruit, 1-Berry, Disp: , Rfl:    omeprazole  (PRILOSEC) 20 MG capsule, TAKE (1) CAPSULE BY MOUTH TWICE DAILY BEFORE A MEAL, Disp: 180 capsule, Rfl: 3   rosuvastatin  (CRESTOR ) 40 MG tablet, TAKE 1 TABLET BY MOUTH DAILY, Disp: 90 tablet, Rfl: 3  Social History: Social History   Tobacco Use   Smoking status: Former    Current packs/day: 0.00    Types: Cigarettes    Quit date: 08/31/1984    Years since quitting: 39.4   Smokeless tobacco: Former    Quit date: 02/05/1991   Tobacco comments:    only smoked for one year. quit in 1985. quit smokeless tobacco in  1992  Vaping Use   Vaping status: Never Used  Substance Use Topics   Alcohol use: Yes    Alcohol/week: 4.0 standard drinks  of alcohol    Types: 4 Cans of beer per week   Drug use: Not Currently    Family Medical History: Family History  Problem Relation Age of Onset   Breast cancer Mother    CAD Father    Pulmonary fibrosis Father     Physical Examination: There were no vitals filed for this visit.  General: Patient is in no apparent distress. Attention to examination is appropriate.  Neck:   Supple.  Full range of motion.  Respiratory: Patient is breathing without any difficulty.   NEUROLOGICAL:     Awake, alert, oriented to person, place, and time.  Speech is clear and fluent.   Cranial Nerves: Pupils equal round and reactive to light.  Facial tone is symmetric.  Facial sensation is symmetric. Shoulder shrug is symmetric. Tongue protrusion is midline.  There is no pronator drift.  Strength: Side Biceps Triceps Deltoid Interossei Grip Wrist Ext. Wrist Flex.  R 5 5 5 5 5 5 5   L 5 5 5 5 5 5 5    Side Iliopsoas Quads Hamstring PF DF EHL  R 5 5 5 5 5 5   L 5 5 5 5 5 5    Reflexes are 1+ and symmetric at the biceps, triceps, brachioradialis, patella and achilles.   Hoffman's is absent.   Bilateral upper and lower extremity sensation is intact to light touch.    No evidence of dysmetria noted.  Gait is abnormal and requires a cane.     Medical Decision Making  Imaging: MR L spine 11/18/2023 FINDINGS:  There are 5 nonrib-bearing lumbar-type vertebrae. The conus medullaris terminates at a normal level. Trace grade 1 retrolisthesis of L2 on L3 and L3 on L4. The vertebral body heights are maintained. Mild multilevel intervertebral disc height loss. No suspicious marrow signal.   L1-2: Right subarticular disc protrusion effaces the right lateral recess impinging the exiting right L1 nerve root. Mild bilateral neural foraminal stenosis. No significant spinal canal stenosis.  L2-3: No significant spinal canal stenosis. Mild bilateral neural foraminal stenosis.  L3-4: Disc bulge contributes to mild  spinal canal stenosis. Facet arthrosis contributes to mild/moderate left and moderate right neural foraminal stenosis.  L4-5: No significant spinal canal stenosis. Facet arthrosis contributes to moderate/severe right and moderate left neural foraminal stenosis.  L5-S1: No significant spinal canal stenosis. Disc bulge. Facet arthrosis contributes to moderate right and moderate/severe left neural foraminal stenosis.   I have personally reviewed the images and agree with the above interpretation.  Assessment and Plan: Phillip Spencer is a pleasant 63 y.o. 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  We reviewed that this can be treated with either Far lateral foraminotomy and discectomy versus transforaminal lumbar interbody fusion.  We would try a more limited operation with a foraminotomies first.  Before proceeding with this, he will clarify how long he has been doing physical therapy and will let us  know.  I discussed the planned procedure (L L5/S1 far lateral foraminotomy and discectomy) at length with the patient, including the risks, benefits, alternatives, and indications. The risks discussed include but are not limited to bleeding, infection, need for reoperation, spinal fluid leak, stroke, vision loss, anesthetic complication, coma, paralysis, and even death. I also described in detail that improvement was not guaranteed.  The patient expressed understanding of these risks. I described the surgery in layman's terms, and gave ample opportunity for questions, which were answered to the best of my ability.  He will be in touch with us  after he has clarified his physical therapy duration.     Thank you for involving me in the care of this patient.      Arina Torry K. Clois MD, [facility] Neurosurgery

## 2024-02-17 ENCOUNTER — Encounter: Payer: Self-pay | Admitting: Neurosurgery

## 2024-02-18 ENCOUNTER — Ambulatory Visit: Admitting: Neurosurgery

## 2024-02-18 VITALS — BP 134/86 | Ht 72.0 in | Wt 312.0 lb

## 2024-02-18 DIAGNOSIS — M48061 Spinal stenosis, lumbar region without neurogenic claudication: Secondary | ICD-10-CM | POA: Diagnosis not present

## 2024-02-18 DIAGNOSIS — M5117 Intervertebral disc disorders with radiculopathy, lumbosacral region: Secondary | ICD-10-CM

## 2024-02-18 DIAGNOSIS — M5416 Radiculopathy, lumbar region: Secondary | ICD-10-CM

## 2024-02-24 ENCOUNTER — Encounter: Payer: Self-pay | Admitting: Neurosurgery

## 2024-03-02 ENCOUNTER — Encounter: Payer: Self-pay | Admitting: Family Medicine

## 2024-03-02 ENCOUNTER — Other Ambulatory Visit: Payer: Self-pay | Admitting: Family Medicine

## 2024-03-02 ENCOUNTER — Encounter: Payer: Self-pay | Admitting: Neurosurgery

## 2024-03-02 DIAGNOSIS — L659 Nonscarring hair loss, unspecified: Secondary | ICD-10-CM

## 2024-03-02 MED ORDER — FINASTERIDE 5 MG PO TABS: 5.0000 mg | ORAL_TABLET | Freq: Every day | ORAL | 3 refills | Status: AC

## 2024-03-02 MED ORDER — ROSUVASTATIN CALCIUM 40 MG PO TABS: 40.0000 mg | ORAL_TABLET | Freq: Every day | ORAL | 3 refills | Status: AC

## 2024-03-02 MED ORDER — LISINOPRIL 10 MG PO TABS: 10.0000 mg | ORAL_TABLET | Freq: Every day | ORAL | 3 refills | Status: AC

## 2024-03-02 MED ORDER — OMEPRAZOLE 20 MG PO CPDR: DELAYED_RELEASE_CAPSULE | ORAL | 3 refills | Status: AC

## 2024-04-05 ENCOUNTER — Encounter: Payer: Self-pay | Admitting: Family Medicine

## 2024-04-05 ENCOUNTER — Ambulatory Visit: Admitting: Neurosurgery

## 2024-04-05 ENCOUNTER — Other Ambulatory Visit: Payer: Self-pay

## 2024-04-05 ENCOUNTER — Other Ambulatory Visit: Payer: Self-pay | Admitting: Family Medicine

## 2024-04-05 DIAGNOSIS — M25559 Pain in unspecified hip: Secondary | ICD-10-CM

## 2024-04-05 DIAGNOSIS — F909 Attention-deficit hyperactivity disorder, unspecified type: Secondary | ICD-10-CM

## 2024-04-05 MED ORDER — AMPHETAMINE-DEXTROAMPHET ER 10 MG PO CP24
ORAL_CAPSULE | ORAL | 0 refills | Status: DC
Start: 2024-04-05 — End: 2024-05-25

## 2024-04-05 MED ORDER — AMPHETAMINE-DEXTROAMPHET ER 10 MG PO CP24: ORAL_CAPSULE | ORAL | 0 refills | Status: DC

## 2024-04-05 NOTE — Telephone Encounter (Signed)
 Nurses-please go ahead with referral that patient is requesting to [facility] for hip pain-Dr. Krasinski  Also let Glendia know that I sent in 2 prescriptions for his ADD medicine which will cover him until he follows up in a couple months-these were sent to [facility] in [facility]

## 2024-04-29 ENCOUNTER — Encounter: Payer: Self-pay | Admitting: Family Medicine

## 2024-04-29 DIAGNOSIS — M25559 Pain in unspecified hip: Secondary | ICD-10-CM

## 2024-05-04 ENCOUNTER — Other Ambulatory Visit: Payer: Self-pay | Admitting: Family Medicine

## 2024-05-04 MED ORDER — GABAPENTIN 300 MG PO CAPS: ORAL_CAPSULE | ORAL | 5 refills | Status: AC

## 2024-05-04 MED ORDER — GABAPENTIN 300 MG PO CAPS: ORAL_CAPSULE | ORAL | 5 refills | Status: DC

## 2024-05-04 NOTE — Telephone Encounter (Signed)
 Nurses Please go ahead with the referral as requested Plus refill was sent in we will see him at his follow-up visit thank you

## 2024-05-25 ENCOUNTER — Encounter: Payer: Self-pay | Admitting: Family Medicine

## 2024-05-25 ENCOUNTER — Ambulatory Visit: Admitting: Family Medicine

## 2024-05-25 VITALS — BP 137/80 | HR 77 | Ht 72.0 in | Wt 314.6 lb

## 2024-05-25 DIAGNOSIS — M5432 Sciatica, left side: Secondary | ICD-10-CM | POA: Diagnosis not present

## 2024-05-25 DIAGNOSIS — M25559 Pain in unspecified hip: Secondary | ICD-10-CM

## 2024-05-25 DIAGNOSIS — F909 Attention-deficit hyperactivity disorder, unspecified type: Secondary | ICD-10-CM | POA: Diagnosis not present

## 2024-05-25 MED ORDER — AMPHETAMINE-DEXTROAMPHET ER 10 MG PO CP24
ORAL_CAPSULE | ORAL | 0 refills | Status: AC
Start: 2024-05-25 — End: ?

## 2024-05-25 NOTE — Patient Instructions (Signed)
 Hi Phillip Spencer I wish you well for your surgery and also hope your back issue improves  We will send in 3 prescription today Message us  when you are ready for the fourth prescription Look forward to seeing you in the spring time-feel free to reach out with any questions or concerns-please take care-Trenika Hudson

## 2024-05-25 NOTE — Progress Notes (Signed)
   Subjective:    Patient ID: Phillip Spencer, male    DOB: 07/05/1961, 63 y.o.   MRN: [id number]  HPI This patient has adult ADD. Takes medication responsibly. Medication does help the patient focus in be more functional. Patient relates that they are or not abusing the medication or misusing the medication. The patient understands that if they're having any negative side effects such as elevated high blood pressure severe headaches they would need stop the medication follow-up immediately. They also understand that the prescriptions are to last for 3 months then the patient will need to follow-up before having further prescriptions.  Patient compliance good compliance  Does medication help patient function /attention better helps him focus  Side effects denies side effects  Patient does have back pain and sciatica pain taking medication for this Will be seeing a back specialist in the near future Patient also working hard difficult for him to lose weight and keep it off Having significant problems with osteoarthritis of both hips    Review of Systems     Objective:   Physical Exam General-in no acute distress Eyes-no discharge Lungs-respiratory rate normal, CTA CV-no murmurs,RRR Extremities skin warm dry no edema Neuro grossly normal Behavior normal, alert        Assessment & Plan:  1. Attention deficit hyperactivity disorder (ADHD), unspecified ADHD type The patient was seen today as part of the visit regarding ADD.  Patient is stable on current regimen.  Appropriate prescriptions prescribed.  Medications were reviewed with the patient as well as compliance. Side effects were checked for. Discussion regarding effectiveness was held. Prescriptions were electronically sent in.  Patient reminded to follow-up in approximately 3 months.   Plans to [place]  law with drug registry was checked and verified while present with the patient. Overall doing well on  medicine Medicine does benefit him Patient patient fires with ready for the fourth prescription - amphetamine -dextroamphetamine  (ADDERALL XR) 10 MG 24 hr capsule; TAKE (1) CAPSULE BY MOUTH EVERY DAY  Dispense: 30 capsule; Refill: 0 - amphetamine -dextroamphetamine  (ADDERALL XR) 10 MG 24 hr capsule; TAKE (1) CAPSULE BY MOUTH EVERY DAY  Dispense: 30 capsule; Refill: 0 - amphetamine -dextroamphetamine  (ADDERALL XR) 10 MG 24 hr capsule; TAKE (1) CAPSULE BY MOUTH EVERY DAY  Dispense: 30 capsule; Refill: 0  2. Hip pain, unspecified laterality (Primary) Recently diagnosed with severe osteoarthritis of both hips worse on the left than the right Has surgery coming up in January He is capable of doing 4 METS without trouble such as walking up a flight of steps housework and yard work but currently avoiding that because of his hip pain Patient will keep us  posted on how his surgery goes 3. Sciatica, left side Takes gabapentin for this will be seen back specialist later this month  Comprehensive lab work on follow-up follow-up in 4 months

## 2024-07-24 ENCOUNTER — Encounter: Payer: Self-pay | Admitting: Family Medicine

## 2024-07-25 ENCOUNTER — Other Ambulatory Visit: Payer: Self-pay

## 2024-07-25 DIAGNOSIS — E7849 Other hyperlipidemia: Secondary | ICD-10-CM

## 2024-07-25 MED ORDER — CHOLESTYRAMINE LIGHT 4 GM/DOSE PO POWD: ORAL | 11 refills | Status: AC

## 2024-09-29 ENCOUNTER — Ambulatory Visit: Admitting: Family Medicine
